# Patient Record
Sex: Female | Born: 1975 | Race: White | Hispanic: No | Marital: Married | Smoking: Never smoker
Health system: Southern US, Community
[De-identification: ages and names within clinical notes are randomized; demographics above are authoritative.]

## PROBLEM LIST (undated history)

## (undated) DIAGNOSIS — G43909 Migraine, unspecified, not intractable, without status migrainosus: Secondary | ICD-10-CM

## (undated) DIAGNOSIS — T7840XA Allergy, unspecified, initial encounter: Secondary | ICD-10-CM

## (undated) DIAGNOSIS — R87619 Unspecified abnormal cytological findings in specimens from cervix uteri: Secondary | ICD-10-CM

## (undated) DIAGNOSIS — L509 Urticaria, unspecified: Secondary | ICD-10-CM

## (undated) DIAGNOSIS — B019 Varicella without complication: Secondary | ICD-10-CM

## (undated) HISTORY — DX: Migraine, unspecified, not intractable, without status migrainosus: G43.909

## (undated) HISTORY — DX: Varicella without complication: B01.9

## (undated) HISTORY — DX: Unspecified abnormal cytological findings in specimens from cervix uteri: R87.619

## (undated) HISTORY — DX: Urticaria, unspecified: L50.9

## (undated) HISTORY — PX: NO PAST SURGERIES: SHX2092

## (undated) HISTORY — DX: Allergy, unspecified, initial encounter: T78.40XA

---

## 2005-11-06 ENCOUNTER — Ambulatory Visit: Payer: Self-pay

## 2006-11-11 ENCOUNTER — Observation Stay: Payer: Self-pay | Admitting: Obstetrics and Gynecology

## 2006-11-29 ENCOUNTER — Inpatient Hospital Stay: Payer: Self-pay

## 2009-02-07 ENCOUNTER — Ambulatory Visit: Payer: Self-pay | Admitting: Otolaryngology

## 2012-09-06 ENCOUNTER — Inpatient Hospital Stay: Payer: Self-pay

## 2012-09-06 LAB — CBC WITH DIFFERENTIAL/PLATELET
Basophil #: 0.1 10*3/uL (ref 0.0–0.1)
Basophil %: 0.5 %
Eosinophil #: 0.1 10*3/uL (ref 0.0–0.7)
Eosinophil %: 0.9 %
HCT: 38.6 % (ref 35.0–47.0)
HGB: 13.1 g/dL (ref 12.0–16.0)
Lymphocyte #: 1.9 10*3/uL (ref 1.0–3.6)
Lymphocyte %: 19.5 %
MCH: 30.8 pg (ref 26.0–34.0)
MCHC: 34 g/dL (ref 32.0–36.0)
MCV: 91 fL (ref 80–100)
Monocyte #: 0.5 x10 3/mm (ref 0.2–0.9)
Monocyte %: 5.1 %
Neutrophil #: 7.2 10*3/uL — ABNORMAL HIGH (ref 1.4–6.5)
Neutrophil %: 74 %
Platelet: 159 10*3/uL (ref 150–440)
RBC: 4.26 10*6/uL (ref 3.80–5.20)
RDW: 13.5 % (ref 11.5–14.5)
WBC: 9.8 10*3/uL (ref 3.6–11.0)

## 2012-09-08 LAB — HEMATOCRIT: HCT: 33.7 % — ABNORMAL LOW (ref 35.0–47.0)

## 2014-10-24 NOTE — H&P (Signed)
L&D Evaluation:  History Expanded:  HPI 39 yo [id number] whose 09/25/12.  Pt followed at [facility] fpr thiw pregnancy.   Blood Type (Maternal) B positive   Group B Strep Results Maternal (Result >5wks must be treated as unknown) negative   Maternal HIV Negative   Maternal Syphilis Ab Nonreactive   Maternal Varicella Immune   Rubella Results (Maternal) immune   EDC 25-Sep-2012   Presents with abdominal pain   Patient's Medical History No Chronic Illness   Patient's Surgical History none   Medications Pre Natal Vitamins   Allergies NKDA   Social History none   Exam:  Vital Signs stable   General labor   Mental Status clear   Chest clear   Heart normal sinus rhythm   Abdomen gravid, non-tender   Estimated Fetal Weight Average for gestational age   Reflexes 2+   Pelvic 5 cm   Mebranes Intact   FHT normal rate with no decels   Ucx regular   Impression:  Impression active labor   Plan:  Plan monitor contractions and for cervical change   Electronic Signatures: Towana Badgerosenow, Philip J (MD)  (Signed 24-Mar-14 22:15)  Authored: L&D Evaluation   Last Updated: 24-Mar-14 22:15 by Towana Badgerosenow, Philip J (MD)

## 2014-12-29 LAB — HM PAP SMEAR: HM Pap smear: NEGATIVE

## 2017-08-04 ENCOUNTER — Other Ambulatory Visit: Payer: Self-pay | Admitting: Obstetrics and Gynecology

## 2017-08-04 DIAGNOSIS — Z1231 Encounter for screening mammogram for malignant neoplasm of breast: Secondary | ICD-10-CM

## 2017-09-23 ENCOUNTER — Encounter: Payer: Self-pay | Admitting: Obstetrics and Gynecology

## 2017-09-23 ENCOUNTER — Ambulatory Visit (INDEPENDENT_AMBULATORY_CARE_PROVIDER_SITE_OTHER): Payer: BC Managed Care – PPO | Admitting: Obstetrics and Gynecology

## 2017-09-23 VITALS — BP 118/76 | Ht 62.0 in | Wt 119.0 lb

## 2017-09-23 DIAGNOSIS — Z1339 Encounter for screening examination for other mental health and behavioral disorders: Secondary | ICD-10-CM | POA: Diagnosis not present

## 2017-09-23 DIAGNOSIS — Z124 Encounter for screening for malignant neoplasm of cervix: Secondary | ICD-10-CM

## 2017-09-23 DIAGNOSIS — Z1331 Encounter for screening for depression: Secondary | ICD-10-CM | POA: Diagnosis not present

## 2017-09-23 DIAGNOSIS — Z01419 Encounter for gynecological examination (general) (routine) without abnormal findings: Secondary | ICD-10-CM

## 2017-09-23 NOTE — Progress Notes (Signed)
Gynecology Annual Exam   PCP: Patient, No Pcp Per  Chief Complaint  Patient presents with  . Annual Exam  . Gynecologic Exam   History of Present Illness:  [name] is a 42 y.o. [id number] who LMP was Patient's last menstrual period was 08/17/2017., presents today for her annual examination.  Her menses are regular every 28-30 days, lasting 7 day(s).  Dysmenorrhea none. She does not have intermenstrual bleeding.  She does not have vasomotor symptoms.   She is single partner, contraception - vasectomy. She does not have vaginal dryness.  Last Pap: 3 years ago  Results were: no abnormalities /neg HPV DNA.  Hx of STDs: none  Last mammogram: has never had one. There is no family history of breast cancer. There is no family history of ovarian cancer. The patient does not do self-breast exams.  Colonoscopy: has never had DEXA: has not been screened for osteoporosis  Tobacco use: The patient denies current or previous tobacco use. Alcohol use: social drinker Exercise: moderately active  The patient wears seatbelts: yes.     Still with occasional right leg pain with menses. Not each month. Just during menses.She notes increased prominence and varicosity of her right leg veins for a few days.   Past Medical History:  Diagnosis Date  . Abnormal Pap smear of cervix    Past Surgical History: denies  Prior to Admission medications   Denies   Allergies  Allergen Reactions  . Amoxicillin   . Azithromycin   . Doxycycline   . Levaquin [Levofloxacin In D5w]    Obstetric History: [id number], s/p SVD x 3  Family History  Problem Relation Age of Onset  . Hypertension Father   . Uterine cancer Maternal Grandmother     Social History   Socioeconomic History  . Marital status: Married    Spouse name: Not on file  . Number of children: Not on file  . Years of education: Not on file  . Highest education level: Not on file  Occupational History  . Not on file  Social  Needs  . Financial resource strain: Not on file  . Food insecurity:    Worry: Not on file    Inability: Not on file  . Transportation needs:    Medical: Not on file    Non-medical: Not on file  Tobacco Use  . Smoking status: Never Smoker  . Smokeless tobacco: Never Used  Substance and Sexual Activity  . Alcohol use: Yes  . Drug use: Not Currently  . Sexual activity: Yes    Birth control/protection: None  Lifestyle  . Physical activity:    Days per week: Not on file    Minutes per session: Not on file  . Stress: Not on file  Relationships  . Social connections:    Talks on phone: Not on file    Gets together: Not on file    Attends religious service: Not on file    Active member of club or organization: Not on file    Attends meetings of clubs or organizations: Not on file    Relationship status: Not on file  . Intimate partner violence:    Fear of current or ex partner: Not on file    Emotionally abused: Not on file    Physically abused: Not on file    Forced sexual activity: Not on file  Other Topics Concern  . Not on file  Social History Narrative  . Not on file  Review of Systems  Constitutional: Negative.   HENT: Negative.   Eyes: Negative.   Respiratory: Negative.   Cardiovascular: Negative.   Gastrointestinal: Negative.   Genitourinary: Negative.   Musculoskeletal: Negative.   Skin: Negative.   Neurological: Negative.   Psychiatric/Behavioral: Negative.      Physical Exam BP 118/76   Ht 5\' 2"  (1.575 m)   Wt 119 lb (54 kg)   LMP 08/17/2017   BMI 21.77 kg/m   Physical Exam  Constitutional: She is oriented to person, place, and time. She appears well-developed and well-nourished. No distress.  Genitourinary: Uterus normal. Pelvic exam was performed with patient supine. There is no rash, tenderness, lesion or injury on the right labia. There is no rash, tenderness, lesion or injury on the left labia. No erythema, tenderness or bleeding in the vagina.  No signs of injury around the vagina. No vaginal discharge found. Right adnexum does not display mass, does not display tenderness and does not display fullness. Left adnexum does not display mass, does not display tenderness and does not display fullness. Cervix does not exhibit motion tenderness, lesion, discharge or polyp.   Uterus is mobile and anteverted. Uterus is not enlarged, tender or exhibiting a mass.  HENT:  Head: Normocephalic and atraumatic.  Eyes: EOM are normal. No scleral icterus.  Neck: Normal range of motion. Neck supple. No thyromegaly present.  Cardiovascular: Normal rate and regular rhythm. Exam reveals no gallop and no friction rub.  No murmur heard. Pulmonary/Chest: Effort normal and breath sounds normal. No respiratory distress. She has no wheezes. She has no rales. Right breast exhibits no inverted nipple, no mass, no nipple discharge, no skin change and no tenderness. Left breast exhibits no inverted nipple, no mass, no nipple discharge, no skin change and no tenderness.  Abdominal: Soft. Bowel sounds are normal. She exhibits no distension and no mass. There is no tenderness. There is no rebound and no guarding.  Musculoskeletal: Normal range of motion. She exhibits no edema or tenderness.  Lymphadenopathy:    She has no cervical adenopathy.       Right: No inguinal adenopathy present.       Left: No inguinal adenopathy present.  Neurological: She is alert and oriented to person, place, and time. No cranial nerve deficit.  Skin: Skin is warm and dry. No rash noted. No erythema.  Psychiatric: She has a normal mood and affect. Her behavior is normal. Judgment normal.    Female chaperone present for pelvic and breast  portions of the physical exam  Results: AUDIT Questionnaire (screen for alcoholism): 3 PHQ-9: 1  Assessment: 42 y.o. [id number] female here for routine gynecologic examination.  Plan: Problem List Items Addressed This Visit    None    Visit  Diagnoses    Women's annual routine gynecological examination    -  Primary   Relevant Orders   IGP, Aptima HPV, rfx 16/18,45   Screening for depression       Screening for alcoholism       Pap smear for cervical cancer screening       Relevant Orders   IGP, Aptima HPV, rfx 16/18,45      Screening: -- Blood pressure screen normal -- Colonoscopy - not due -- Mammogram - due. Patient to call [place] to arrange. She understands that it is her responsibility to arrange this. -- Weight screening: normal -- Depression screening negative (PHQ-9) -- Nutrition: normal -- cholesterol screening: not due for screening -- osteoporosis screening: not due --  tobacco screening: not using -- alcohol screening: AUDIT questionnaire indicates low-risk usage. -- family history of breast cancer screening: done. not at high risk. -- no evidence of domestic violence or intimate partner violence. -- STD screening: gonorrhea/chlamydia NAAT not collected per patient request. -- pap smear collected per ASCCP guidelines -- HPV vaccination series: not eligilbe  Thomasene MohairStephen Iceis Knab, MD 09/23/2017 5:29 PM

## 2017-09-28 LAB — IGP, APTIMA HPV, RFX 16/18,45
HPV Aptima: NEGATIVE
PAP Smear Comment: 0

## 2018-01-11 ENCOUNTER — Other Ambulatory Visit: Payer: Self-pay | Admitting: Obstetrics and Gynecology

## 2018-01-11 DIAGNOSIS — Z1231 Encounter for screening mammogram for malignant neoplasm of breast: Secondary | ICD-10-CM

## 2018-02-16 ENCOUNTER — Ambulatory Visit
Admission: RE | Admit: 2018-02-16 | Discharge: 2018-02-16 | Disposition: A | Discharge status: Home / Self Care | Payer: BC Managed Care – PPO | Source: Ambulatory Visit | Attending: Obstetrics and Gynecology | Admitting: Obstetrics and Gynecology

## 2018-02-16 DIAGNOSIS — Z1231 Encounter for screening mammogram for malignant neoplasm of breast: Secondary | ICD-10-CM

## 2018-08-25 ENCOUNTER — Ambulatory Visit: Payer: BC Managed Care – PPO | Admitting: Family

## 2018-08-25 ENCOUNTER — Other Ambulatory Visit: Payer: Self-pay

## 2018-08-25 ENCOUNTER — Encounter: Payer: Self-pay | Admitting: Family

## 2018-08-25 VITALS — BP 100/62 | HR 88 | Temp 97.9°F | Ht 63.0 in | Wt 116.8 lb

## 2018-08-25 DIAGNOSIS — L509 Urticaria, unspecified: Secondary | ICD-10-CM

## 2018-08-25 DIAGNOSIS — Z0000 Encounter for general adult medical examination without abnormal findings: Secondary | ICD-10-CM | POA: Diagnosis not present

## 2018-08-25 NOTE — Assessment & Plan Note (Signed)
Resolved at this time.  Patient is well-appearing, nontoxic in appearance.  Strong history of allergies.  No other systemic features I could identified.  She politely declines labs today.  We jointly agreed that a referral to allergy is an appropriate next step.  Patient provided information on total histamine blockade if rash were to recur.  Patient will remain vigilant and let me know if recurs

## 2018-08-25 NOTE — Patient Instructions (Addendum)
Very nice to meet you .   Let's start with allergy referral.   Today we discussed referrals, orders. Allergist.    I have placed these orders in the system for you.  Please be sure to give [place] a call if you have not heard from our office regarding this. We should hear from [place] within ONE week with information regarding your appointment. If not, please let me know immediately.   Hives of unknown cause   Tips on Aggravating factors -    These include:  ?Physical factors -  As an example, heat (hot showers, extreme humidity) is a common trigger for many , and tight clothing or straps can also aggravate symptoms.   ?Anti-inflammatory medications - Nonsteroidal anti-inflammatory drugs (NSAIDs) worsen symptoms   ?Stress - Patients often report more severe symptoms during periods of physical or psychologic stress   ?Variations in dietary habits and alcohol - Although food allergy is a rare cause of, some patients will report that variations in diet, particularly rich meals or spicy foods, will aggravate symptoms. Alcohol also aggravates symptoms in some.  If hives do not improve, please let me know and remember :   Histamine 1 blocker - Either benadryl every 8 hours (may be different dosing/duration depending on formulation, please follow directions on bottle)                                         OR  claritin or zyrtec once daily  Histamine 2 blocker- Pepcid AC or Zantac twice a day

## 2018-08-25 NOTE — Progress Notes (Signed)
Subjective:    Patient ID: Nichole Crawford, female    DOB: [birth year], 43 y.o.   MRN: [id number]  CC: Nichole Crawford is a 43 y.o. female who presents today to establish care.    HPI: Complains of red raised lesions that occurred last week, started on ventral aspects of arms, started on left arm and then seen on ventral aspect of right arm; this occurred approx 45 minutes after taking a shower. Not painful, tender. Then started on right leg. Resolved on its own after 4 days.  No fever, cough, trouble breathing, arthralgia, constipation.   Does note new dove coconut product. Does have cat, new since September. Allergy testing when lived in [place], hasnt been tested since moving to [facility] 2006.  Had gotten allergic shots in the past in [place]. No longer on zyrtec. Will use flonase PRN.    No new detergents. Doesn't think rash it's gluten related.   Not sure if related to jewelry to she was wearing or from stress. Primary reason to establish care.    Didn't have a menses last month, which is typical for her, she sometimes misses a month. Has has similar symptoms around menses. Wandering if related.   More stress than normal. Current remodel at their home.   PCP had been GYN prior.   Declines labs today.    HISTORY:  Past Medical History:  Diagnosis Date  . Abnormal Pap smear of cervix   . Allergy    Hay fever/seasonal allergies   . Chicken pox   . Migraines    History reviewed. No pertinent surgical history. Family History  Problem Relation Age of Onset  . Uterine cancer Maternal Grandmother   . COPD Maternal Grandmother   . Other Maternal Grandmother        Huntington's disease  . Huntington's disease Mother   . Asthma Mother   . Other Mother        Huntington's disease  . Mental illness Sister        mulitple personality disorder  . Heart disease Maternal Grandfather   . Hyperlipidemia Maternal Grandfather   . Asthma Paternal Grandmother   . Drug abuse Paternal Grandmother   .  Heart disease Paternal Grandmother   . Hyperlipidemia Paternal Grandmother   . Kidney disease Paternal Grandmother   . Alzheimer's disease Paternal Grandfather     Allergies: Amoxicillin; Azithromycin; Doxycycline; Levaquin [levofloxacin in d5w]; and Penicillins Current Outpatient Medications on File Prior to Visit  Medication Sig Dispense Refill  . Probiotic Product (PROBIOTIC PO) Take 1 capsule by mouth daily.     No current facility-administered medications on file prior to visit.     Social History   Tobacco Use  . Smoking status: Never Smoker  . Smokeless tobacco: Never Used  Substance Use Topics  . Alcohol use: Yes  . Drug use: Not Currently    Review of Systems  Constitutional: Negative for chills and fever.  Respiratory: Negative for cough.   Cardiovascular: Negative for chest pain and palpitations.  Gastrointestinal: Negative for nausea and vomiting.  Musculoskeletal: Negative for arthralgias.  Skin: Positive for rash.      Objective:    BP 100/62 (BP Location: Left Arm, Patient Position: Sitting, Cuff Size: Normal)   Pulse 88   Temp 97.9 F (36.6 C)   Ht 5\' 3"  (1.6 m)   Wt 116 lb 12.8 oz (53 kg)   SpO2 98%   BMI 20.69 kg/m  BP Readings from Last 3  Encounters:  08/25/18 100/62  09/23/17 118/76   Wt Readings from Last 3 Encounters:  08/25/18 116 lb 12.8 oz (53 kg)  09/23/17 119 lb (54 kg)    Physical Exam Vitals signs reviewed.  Constitutional:      Appearance: She is well-developed.  Eyes:     Conjunctiva/sclera: Conjunctivae normal.  Neck:     Thyroid: No thyroid mass or thyromegaly.  Cardiovascular:     Rate and Rhythm: Normal rate and regular rhythm.     Pulses: Normal pulses.     Heart sounds: Normal heart sounds.  Pulmonary:     Effort: Pulmonary effort is normal.     Breath sounds: Normal breath sounds. No wheezing, rhonchi or rales.  Lymphadenopathy:     Head:     Right side of head: No submental, submandibular, tonsillar,  preauricular, posterior auricular or occipital adenopathy.     Left side of head: No submental, submandibular, tonsillar, preauricular, posterior auricular or occipital adenopathy.     Cervical: No cervical adenopathy.  Skin:    General: Skin is warm and dry.  Neurological:     Mental Status: She is alert.  Psychiatric:        Speech: Speech normal.        Behavior: Behavior normal.        Thought Content: Thought content normal.        Assessment & Plan:   Problem List Items Addressed This Visit      Musculoskeletal and Integument   Hives - Primary    Resolved at this time.  Patient is well-appearing, nontoxic in appearance.  Strong history of allergies.  No other systemic features I could identified.  She politely declines labs today.  We jointly agreed that a referral to allergy is an appropriate next step.  Patient provided information on total histamine blockade if rash were to recur.  Patient will remain vigilant and let me know if recurs      Relevant Orders   Ambulatory referral to Allergy     Other   Encounter for medical examination to establish care    Reviewed medical history with patient today.          I am having Hilary Hertz. Cellucci maintain her Probiotic Product (PROBIOTIC PO).   No orders of the defined types were placed in this encounter.   Return precautions given.   Risks, benefits, and alternatives of the medications and treatment plan prescribed today were discussed, and patient expressed understanding.   Education regarding symptom management and diagnosis given to patient on AVS.  Continue to follow with Conard Novak, MD for routine health maintenance.   Hilary Hertz Korver and I agreed with plan.   Rennie Plowman, FNP

## 2018-08-25 NOTE — Assessment & Plan Note (Signed)
Reviewed medical history with patient today 

## 2018-10-06 ENCOUNTER — Ambulatory Visit: Payer: BC Managed Care – PPO | Admitting: Family

## 2018-11-12 ENCOUNTER — Encounter: Payer: Self-pay | Admitting: Family

## 2018-11-12 ENCOUNTER — Ambulatory Visit (INDEPENDENT_AMBULATORY_CARE_PROVIDER_SITE_OTHER): Payer: BC Managed Care – PPO | Admitting: Family

## 2018-11-12 ENCOUNTER — Other Ambulatory Visit: Payer: Self-pay

## 2018-11-12 DIAGNOSIS — R51 Headache: Secondary | ICD-10-CM

## 2018-11-12 DIAGNOSIS — L509 Urticaria, unspecified: Secondary | ICD-10-CM

## 2018-11-12 DIAGNOSIS — R519 Headache, unspecified: Secondary | ICD-10-CM | POA: Insufficient documentation

## 2018-11-12 NOTE — Assessment & Plan Note (Addendum)
HA occurs monthly. Discussed limitations of virtual medicine and the absence of physical exam. No alarm symptoms appreciated over call today. Declines MRI Brain, preventative medication as well as medication for anxiety. She will let me know if HA becomes more bothersome, new or worsening symptoms.

## 2018-11-12 NOTE — Progress Notes (Signed)
This visit type was conducted due to national recommendations for restrictions regarding the COVID-19 pandemic (e.g. social distancing).  This format is felt to be most appropriate for this patient at this time.  All issues noted in this document were discussed and addressed.  No physical exam was performed (except for noted visual exam findings with Video Visits). Virtual Visit via Video Note  I connected with@  on 11/12/18 at  9:30 AM EDT by a video enabled telemedicine application and verified that I am speaking with the correct person using two identifiers.  Location patient: home Location provider:work Persons participating in the virtual visit: patient, provider  I discussed the limitations of evaluation and management by telemedicine and the availability of in person appointments. The patient expressed understanding and agreed to proceed.  Interactive audio and video telecommunications were attempted between this provider and patient, however failed, due to patient having technical difficulties or patient did not have access to video capability.  I was able to see patient initially for video however connection became poor. We continued and completed visit with audio only.   HPI:  Complains of 'sinus headache' , comes and goes; occurs approx once month. Describes as 'pressure.' Not worse HA of life.  Behind both eyes, cheeks,and nose. Occasional nasal drainage.  Teeth grinding makes worse, compliant withwearing oral appliance.  Weather changes, being on computer more, and stress triggers HA as well.  resolves with motrin.  No  vision changes.  HA does not awake her at night, HA is not positional. No  facial numbness/paralyis, nausea, vomiting, photophobia.  Sleeping well however going to bed late due to increased work load, kids homework. Feels HA is similar to HAs in the past.  Wears glasses.   anxiety from increased work of late Works in Programme researcher, broadcasting/film/videoeducation.  Has 3 children at home  Husband  home as well which has been a big change.    Hives- No recurrence.  never heard from allergy   ROS: See pertinent positives and negatives per HPI.  Past Medical History:  Diagnosis Date  . Abnormal Pap smear of cervix   . Allergy    Hay fever/seasonal allergies   . Chicken pox   . Migraines     History reviewed. No pertinent surgical history.  Family History  Problem Relation Age of Onset  . Uterine cancer Maternal Grandmother   . COPD Maternal Grandmother   . Other Maternal Grandmother        Huntington's disease  . Huntington's disease Mother   . Asthma Mother   . Other Mother        Huntington's disease  . Mental illness Sister        mulitple personality disorder  . Heart disease Maternal Grandfather   . Hyperlipidemia Maternal Grandfather   . Asthma Paternal Grandmother   . Drug abuse Paternal Grandmother   . Heart disease Paternal Grandmother   . Hyperlipidemia Paternal Grandmother   . Kidney disease Paternal Grandmother   . Alzheimer's disease Paternal Grandfather     SOCIAL HX: non smoker   Current Outpatient Medications:  .  CALCIUM-MAGNESIUM-VITAMIN D PO, Take by mouth. Powder. One scoop every other week., Disp: , Rfl:  .  Multiple Vitamins-Minerals (EMERGEN-C IMMUNE PLUS) PACK, Take by mouth., Disp: , Rfl:  .  Probiotic Product (PROBIOTIC PO), Take 1 capsule by mouth daily., Disp: , Rfl:   EXAM: [facility] 11/12/2018 09/23/2017  Decreased Interest 0 0  Down, Depressed, Hopeless 0 0  PHQ - 2 Score 0 0  Altered sleeping 0 1  Tired, decreased energy 0 0  Change in appetite 0 0  Feeling bad or failure about yourself  0 0  Trouble concentrating 0 0  Moving slowly or fidgety/restless 0 0  Suicidal thoughts 0 0  PHQ-9 Score 0 1  Difficult doing work/chores Not difficult at all Not difficult at all    VITALS per patient if applicable:  GENERAL: alert, oriented, appears well and in no acute distress  HEENT: atraumatic, conjunttiva  clear, no obvious abnormalities on inspection of external nose and ears  NECK: normal movements of the head and neck  LUNGS: on inspection no signs of respiratory distress, breathing rate appears normal, no obvious gross SOB, gasping or wheezing  CV: no obvious cyanosis  MS: moves all visible extremities without noticeable abnormality  PSYCH/NEURO: pleasant and cooperative, no obvious depression or anxiety, speech and thought processing grossly intact  ASSESSMENT AND PLAN:  Discussed the following assessment and plan:  Nonintractable headache, unspecified chronicity pattern, unspecified headache type  Hives - Plan: Ambulatory referral to Allergy  Problem List Items Addressed This Visit      Musculoskeletal and Integument   Hives    Referral placed to allergy again      Relevant Orders   Ambulatory referral to Allergy     Other   Nonintractable headache - Primary    HA occurs monthly. Discussed limitations of virtual medicine and the absence of physical exam. No alarm symptoms appreciated over call today. Declines MRI Brain, preventative medication. She will let me know if HA becomes more bothersome, new or worsening symptoms.            I discussed the assessment and treatment plan with the patient. The patient was provided an opportunity to ask questions and all were answered. The patient agreed with the plan and demonstrated an understanding of the instructions.   The patient was advised to call back or seek an in-person evaluation if the symptoms worsen or if the condition fails to improve as anticipated.   Rennie Plowman, FNP

## 2018-11-12 NOTE — Assessment & Plan Note (Signed)
Referral placed to allergy again

## 2018-11-12 NOTE — Patient Instructions (Addendum)
Today we discussed referrals, orders. Allergy testing   I have placed these orders in the system for you.  Please be sure to give [place] a call if you have not heard from our office regarding this. We should hear from [place] within ONE week with information regarding your appointment. If not, please let me know immediately.   Please let me know about headaches and certainly if headache worsens or new symptoms present please let me know.

## 2018-11-12 NOTE — Progress Notes (Signed)
Printed and mailed

## 2018-11-29 ENCOUNTER — Encounter: Payer: Self-pay | Admitting: Family

## 2018-11-29 ENCOUNTER — Ambulatory Visit: Payer: Self-pay | Admitting: *Deleted

## 2018-11-29 ENCOUNTER — Ambulatory Visit (INDEPENDENT_AMBULATORY_CARE_PROVIDER_SITE_OTHER): Payer: BC Managed Care – PPO | Admitting: Family

## 2018-11-29 DIAGNOSIS — W5503XA Scratched by cat, initial encounter: Secondary | ICD-10-CM | POA: Diagnosis not present

## 2018-11-29 DIAGNOSIS — L509 Urticaria, unspecified: Secondary | ICD-10-CM | POA: Diagnosis not present

## 2018-11-29 MED ORDER — SULFAMETHOXAZOLE-TRIMETHOPRIM 800-160 MG PO TABS: 1.0000 | ORAL_TABLET | Freq: Two times a day (BID) | ORAL | 0 refills | Status: DC

## 2018-11-29 NOTE — Assessment & Plan Note (Signed)
Concern for potential lymphadenopathy proximal to cat scratch ( is this acute or chronic).  Started patient on bactrim for ppx due to antibiotic allergies. I have sent message to ID and will have close follow up with patient in 5 days. She will remain vigilant and let me know of any new or worsening concerns.

## 2018-11-29 NOTE — Progress Notes (Signed)
This visit type was conducted due to national recommendations for restrictions regarding the COVID-19 pandemic (e.g. social distancing).  This format is felt to be most appropriate for this patient at this time.  All issues noted in this document were discussed and addressed.  No physical exam was performed (except for noted visual exam findings with Video Visits).   Virtual Visit via Video Note  I connected with@  on 12/01/18 at  3:00 PM EDT by a video enabled telemedicine application and verified that I am speaking with the correct person using two identifiers.  Location patient: home Location provider:work  Persons participating in the virtual visit: patient, provider  I discussed the limitations of evaluation and management by telemedicine and the availability of in person appointments. The patient expressed understanding and agreed to proceed.  Interactive audio and video telecommunications were attempted between this provider and patient, however failed, due to patient having technical difficulties or patient did not have access to video capability.  We continued and completed visit with audio only.   HPI:  Scratched by her own cat x 3 days ago on right upper arm, right side of chest and neck. Washed with warm water, soap; applied neosporin ( expired) , and wanders if neosporin.    Noticed today that hives after getting out of shower primarily right upper arm and right upper chest. Improved. Some itching , not intense. Scratch is healed. No discharge.  Cat is current on all her shots and an indoor cat.   Right axillary lymph node as always noticed especially prior to menses, has noted it is 'tender' . Not enlarged.   No sob, trouble breathing,  arthralgia, fever, vision changes, purulent discharge.   Pictures seen via mychart  Seeing allergy 5/34/20.     ROS: See pertinent positives and negatives per HPI.  Past Medical History:  Diagnosis Date  . Abnormal Pap smear of cervix    . Allergy    Hay fever/seasonal allergies   . Chicken pox   . Migraines     No past surgical history on file.  Family History  Problem Relation Age of Onset  . Uterine cancer Maternal Grandmother   . COPD Maternal Grandmother   . Other Maternal Grandmother        Huntington's disease  . Huntington's disease Mother   . Asthma Mother   . Other Mother        Huntington's disease  . Mental illness Sister        mulitple personality disorder  . Heart disease Maternal Grandfather   . Hyperlipidemia Maternal Grandfather   . Asthma Paternal Grandmother   . Drug abuse Paternal Grandmother   . Heart disease Paternal Grandmother   . Hyperlipidemia Paternal Grandmother   . Kidney disease Paternal Grandmother   . Alzheimer's disease Paternal Grandfather     Never smoker  Current Outpatient Medications:  .  CALCIUM-MAGNESIUM-VITAMIN D PO, Take by mouth. Powder. One scoop every other week., Disp: , Rfl:  .  Multiple Vitamins-Minerals (EMERGEN-C IMMUNE PLUS) PACK, Take by mouth., Disp: , Rfl:  .  Probiotic Product (PROBIOTIC PO), Take 1 capsule by mouth daily., Disp: , Rfl:  .  sulfamethoxazole-trimethoprim (BACTRIM DS) 800-160 MG tablet, Take 1 tablet by mouth 2 (two) times daily., Disp: 20 tablet, Rfl: 0  EXAM:  VITALS per patient if applicable:  GENERAL: alert, oriented, appears well and in no acute distress  HEENT: atraumatic, conjunttiva clear, no obvious abnormalities on inspection of external nose and ears  NECK: normal movements of the head and neck  LUNGS: on inspection no signs of respiratory distress, breathing rate appears normal, no obvious gross SOB, gasping or wheezing  CV: no obvious cyanosis  MS: moves all visible extremities without noticeable abnormality  PSYCH/NEURO: pleasant and cooperative, no obvious depression or anxiety, speech and thought processing grossly intact  SKIN: Well approximated scabbed over laceration seen on right upper arm and axilla  area.  No purulent discharge.  No streaking  ASSESSMENT AND PLAN:  Discussed the following assessment and plan:   Problem List Items Addressed This Visit      Musculoskeletal and Integument   Hives    Idiopathic. Patient appears highly allergic and am considering allergic to one of the products in neosporin, anxiety, and/or change in temperature ( shower) to have caused hives. Advised histamine blockade and close follow up. Pending allergy appointment this  Month.       Cat scratch - Primary    Concern for potential lymphadenopathy proximal to cat scratch ( is this acute or chronic).  Started patient on bactrim for ppx due to antibiotic allergies. I have sent message to ID and will have close follow up with patient in 5 days. She will remain vigilant and let me know of any new or worsening concerns.       Relevant Medications   sulfamethoxazole-trimethoprim (BACTRIM DS) 800-160 MG tablet   Other Relevant Orders   Ambulatory referral to Infectious Disease        I discussed the assessment and treatment plan with the patient. The patient was provided an opportunity to ask questions and all were answered. The patient agreed with the plan and demonstrated an understanding of the instructions.   The patient was advised to call back or seek an in-person evaluation if the symptoms worsen or if the condition fails to improve as anticipated.   Rennie PlowmanMargaret , FNP   I spent 25 min non face to face w/ pt.

## 2018-11-29 NOTE — Telephone Encounter (Signed)
Message from Rayann Heman sent at 11/29/2018 8:35 AM EDT  Summary: hives    It called and stated that her cat scratched her and the spot has hives on it. Please advise          Returned call to patient regarding the scratch from her cat on Friday. She was trying to move the cat from one place to another but the cat became frightened and  Scratched her on the right arm about 2 to 3 inches above the elbow and near her arm pit. She noticed that the one near her arm pit has bumps that look like hives. She denies itching, there is some redness around the scratch. The area tender but she thinks it is be cause she moves her arm. She cleaned the area with soap and water and has used neosporin on it and realized that this medication has expired. She has taken pictures of it and placed in MyChart. Advised of a virtual appointment.  LB at [facility] Call transferred to the practice.  Reason for Disposition . [1] Looks infected (spreading redness, pus) AND [2] no fever  Answer Assessment - Initial Assessment Questions 1. APPEARANCE of INJURY: "What does the injury look like?"      Looks like a rash near wear the cat scratched her on the right arm 2. SIZE: "How large is the cut?"      Not a cut 3. BLEEDING: "Is it bleeding now?" If so, ask: "Is it difficult to stop?"      No bleeding 4. LOCATION: "Where is the injury located?"      Right arm 5. ONSET: "How long ago did the injury occur?"      Friday evening 6. MECHANISM: "Tell me how it happened."      Tried to take the cat in but the cat got scared and scratched her 7. TETANUS: "When was the last tetanus booster?"     Thinks it has been between the last 10 years 8. PREGNANCY: "Is there any chance you are pregnant?" "When was your last menstrual period?"     Not pregnant. LMP was middle of May  Protocols used: [place]

## 2018-11-29 NOTE — Telephone Encounter (Signed)
appt scheduled to see the provider pt notified.  Phillis Thackeray,cma

## 2018-11-29 NOTE — Assessment & Plan Note (Signed)
Idiopathic. Patient appears highly allergic and am considering allergic to one of the products in neosporin, anxiety, and/or change in temperature ( shower) to have caused hives. Advised histamine blockade and close follow up. Pending allergy appointment this  Month.

## 2018-11-29 NOTE — Patient Instructions (Addendum)
Lets stay in close touch and we will have follow up on Friday especially as we stay vigilant with any concerns of infection or development of cat scratch disease. I want you to watch the lymph node carefully.   Start bactrim for empiric therapy for cat scratch disease.   Ensure to take probiotics while on antibiotics and also for 2 weeks after completion. It is important to re-colonize the gut with good bacteria and also to prevent any diarrheal infections associated with antibiotic use.   I have sent urgent message to infectious disease as well.   Today we discussed referrals, orders. Infectious disease.    I have placed these orders in the system for you.  Please be sure to give [place] a call if you have not heard from our office regarding this. We should hear from [place] within ONE week with information regarding your appointment. If not, please let me know immediately.    Hives of unknown cause  Tips on Aggravating factors -    These include:  ?Physical factors -  As an example, heat (hot showers, extreme humidity) is a common trigger for many , and tight clothing or straps can also aggravate symptoms.   ?Anti-inflammatory medications - Nonsteroidal anti-inflammatory drugs (NSAIDs) worsen symptoms   ?Stress - Patients often report more severe symptoms during periods of physical or psychologic stress   ?Variations in dietary habits and alcohol - Although food allergy is a rare cause of, some patients will report that variations in diet, particularly rich meals or spicy foods, will aggravate symptoms. Alcohol also aggravates symptoms in some.  If hives do not improve, please let me know and remember :   Histamine 1 blocker - Either benadryl every 8 hours (may be different dosing/duration depending on formulation, please follow directions on bottle)                                         OR  claritin or zyrtec once daily  Histamine 2 blocker- Pepcid AC twice a day   Cat-Scratch Disease,  Adult Cat-scratch disease is a bacterial infection that is caused by a bite or scratch from an infected cat. It is sometimes called cat-scratch fever. The infection can cause a red bump, swollen glands, and flu-like symptoms. The infection does not spread (is not contagious) between humans. In most cases, the infection is mild and it clears up without treatment. However, a more severe infection may develop in people who have other illnesses or problems that weaken the body's disease-fighting system (immune system). What are the causes? This condition is caused by bacteria called Bartonella henselae. These bacteria may be present in the mouth or on the claws of cats or kittens, especially those that are younger than 44 year old. Cats do not look or act sick when they have this infection. The bacteria can spread to humans through:  A bite or scratch.  Having contact with an infected cat and then touching your eyes or mouth. What increases the risk? You are more likely to develop this condition if:  You own or interact with a cat.  You have a weakened immune system. Your immune system may be weakened if: ? You are pregnant. ? You have certain conditions like cancer, HIV, or AIDS. ? You received a donated organ (transplant). What are the signs or symptoms?     The first symptoms may  be:  A bite or scratch that does not heal.  A red bump near the wound. The bump may be red, warm, and tender to the touch. Other symptoms may take a few weeks to develop. They may include:  Swollen, tender glands (lymph nodes) in the neck or under the arms.  Fever.  Rash.  Joint pain.  Headache.  Low energy.  Poor appetite. How is this diagnosed? This condition is diagnosed based on your symptoms and your history of a scratch or bite from a cat. Your health care provider will examine your skin and look for swollen lymph nodes. You may have tests, such as:  A culture test. This involves testing a  sample of fluid or pus from your wound.  Blood tests.  A lymph node biopsy. This involves removing and testing a tissue sample from a swollen lymph node. How is this treated? Cat-scratch disease usually goes away without treatment in 2-4 months. Your health care provider may recommend home care, such as using a warm cloth (compress) and over-the-counter pain medicine. You may be prescribed antibiotic medicine if:  Your immune system is weakened.  Your symptoms cause discomfort. If you have a painful, swollen lymph node, it may need to be drained with a needle. (This is rare.) Follow these instructions at home:  Rest and return to your normal activities as told by your health care provider.  Take over-the-counter and prescription medicines only as told by your health care provider.  If you were prescribed an antibiotic medicine, take it as told by your health care provider. Do not stop taking the antibiotic even if you start to feel better.  Apply warm compresses to the area as told your health care provider.  Check your wound or swollen gland areas every day for signs of infection, such as: ? Redness, swelling, or pain. ? Fluid or blood. ? Warmth. ? Pus or a bad smell.  Keep all follow-up visits as told by your health care provider. This is important. How is this prevented?  Avoid being scratched or bitten while playing with cats. To do this, avoid playing roughly or taking food away from a cat.  Wash your hands well after you have contact with a cat.  If you get scratched or bitten, wash the injured area as soon as possible with warm water and soap.  Do not let a cat lick your skin if you have any cuts, sores, or scratches.  Do not pick up or play with stray cats.  If you have an indoor cat, do not let your cat outside. Having contact with stray cats may make your cat more likely to have contact with the bacteria. Contact a health care provider if:  You have redness,  swelling, or pain around your wound.  You have fluid, blood, or pus coming from your wound.  Your wound is warm to the touch.  There is pus or a bad smell coming from your wound.  You have a fever.  Your symptoms get worse or do not get better at home. Get help right away if:  You have more swelling of your lymph nodes in your neck or under your arms.  You develop pain in your abdomen.  You develop a skin rash.  You feel dizzy or you faint.  You develop inflammation of your eye or vision problems.  You have pain in your bones.  You develop a stiff neck. Summary  Cat-scratch disease, sometimes called cat-scratch fever, is a  bacterial infection that is caused by a bite or scratch from an infected cat.  The infection can cause a red bump, swollen glands, and flu-like symptoms.  Bacteria that cause this condition may be present in the mouth or on the claws of cats or kittens, especially those that are younger than 43 year old.  If you were prescribed an antibiotic medicine, take it as told by your health care provider. Do not stop taking the antibiotic even if you start to feel better. This information is not intended to replace advice given to you by your health care provider. Make sure you discuss any questions you have with your health care provider. Document Released: 05/30/2000 Document Revised: 02/26/2017 Document Reviewed: 02/26/2017 Elsevier Interactive Patient Education  2019 [employer]

## 2018-12-03 ENCOUNTER — Encounter: Payer: Self-pay | Admitting: Family

## 2018-12-03 ENCOUNTER — Ambulatory Visit (INDEPENDENT_AMBULATORY_CARE_PROVIDER_SITE_OTHER): Payer: BC Managed Care – PPO | Admitting: Family

## 2018-12-03 DIAGNOSIS — W5503XD Scratched by cat, subsequent encounter: Secondary | ICD-10-CM | POA: Diagnosis not present

## 2018-12-03 DIAGNOSIS — N644 Mastodynia: Secondary | ICD-10-CM

## 2018-12-03 DIAGNOSIS — R599 Enlarged lymph nodes, unspecified: Secondary | ICD-10-CM | POA: Diagnosis not present

## 2018-12-03 DIAGNOSIS — W5503XA Scratched by cat, initial encounter: Secondary | ICD-10-CM

## 2018-12-03 NOTE — Progress Notes (Signed)
This visit type was conducted due to national recommendations for restrictions regarding the COVID-19 pandemic (e.g. social distancing).  This format is felt to be most appropriate for this patient at this time.  All issues noted in this document were discussed and addressed.  No physical exam was performed (except for noted visual exam findings with Video Visits). Virtual Visit via Video Note  I connected with@  on 12/03/18 at  2:00 PM EDT by a video enabled telemedicine application and verified that I am speaking with the correct person using two identifiers.  Location patient: home Location provider:work  Persons participating in the virtual visit: patient, provider  I discussed the limitations of evaluation and management by telemedicine and the availability of in person appointments. The patient expressed understanding and agreed to proceed.  Interactive audio and video telecommunications were attempted between this provider and patient, however failed, due to patient having technical difficulties or patient did not have access to video capability.  We continued and completed visit with audio only.    HPI:  Follow up cat scratch.   Redness and itchiness improved. Describes as surrounding skin from scratch is brown in color; it is no longer red.   no purulent discharge.   Right Axillary lymph  Node is still palpable on occasion, 'same size as usually has been in monthly cycle'. Has been there for 12 years. Unable to feel lymph node today.   Also notes, BL breasts both outer quadrant are tender pver past couple of days, right side outer quadrant of breast is more tender, and getting ready to start cycle. These symptoms are typical for her. No masses in breasts appreciated.  No nipple discharge.  No fever, ha,n, v, vision changes.    ROS: See pertinent positives and negatives per HPI.  Past Medical History:  Diagnosis Date  . Abnormal Pap smear of cervix   . Allergy    Hay  fever/seasonal allergies   . Chicken pox   . Migraines     History reviewed. No pertinent surgical history.  Family History  Problem Relation Age of Onset  . Uterine cancer Maternal Grandmother   . COPD Maternal Grandmother   . Other Maternal Grandmother        Huntington's disease  . Huntington's disease Mother   . Asthma Mother   . Other Mother        Huntington's disease  . Mental illness Sister        mulitple personality disorder  . Heart disease Maternal Grandfather   . Hyperlipidemia Maternal Grandfather   . Asthma Paternal Grandmother   . Drug abuse Paternal Grandmother   . Heart disease Paternal Grandmother   . Hyperlipidemia Paternal Grandmother   . Kidney disease Paternal Grandmother   . Alzheimer's disease Paternal Grandfather     SOCIAL HX: never smoker   Current Outpatient Medications:  .  CALCIUM-MAGNESIUM-VITAMIN D PO, Take by mouth. Powder. One scoop every other week., Disp: , Rfl:  .  Multiple Vitamins-Minerals (EMERGEN-C IMMUNE PLUS) PACK, Take by mouth., Disp: , Rfl:  .  Probiotic Product (PROBIOTIC PO), Take 1 capsule by mouth daily., Disp: , Rfl:  .  sulfamethoxazole-trimethoprim (BACTRIM DS) 800-160 MG tablet, Take 1 tablet by mouth 2 (two) times daily., Disp: 20 tablet, Rfl: 0    ASSESSMENT AND PLAN:  Discussed the following assessment and plan: Problem List Items Addressed This Visit      Musculoskeletal and Integument   Cat scratch    Improved on bactrim.  Declines infectious disease consult at this time.  Based on conversation with Terri Piedra, NP with infectious disease, Bactrim is appropriate therapy.  patient will monitor for symptoms and let me know if she does not continue to experience improvement. Acute on chronic right axillary lymph node. Doubt r/t to cat scratch based on chronicity.  Advised dedicated ultrasound for this.  Patient agreeable.  Pending ultrasound.         Other   Breast tenderness    Bilateral, described as  typical, chronic per patient.  Advise her to let me know if does not resolve so we can pursue diagnostic imaging.  Patient verbalized understanding       Other Visit Diagnoses    Enlarged lymph node    -  Primary   Relevant Orders   [place] AXILLA RIGHT      I discussed the assessment and treatment plan with the patient. The patient was provided an opportunity to ask questions and all were answered. The patient agreed with the plan and demonstrated an understanding of the instructions.   The patient was advised to call back or seek an in-person evaluation if the symptoms worsen or if the condition fails to improve as anticipated.   Mable Paris, FNP   I spent 25 min non face to face w/ pt.

## 2018-12-03 NOTE — Assessment & Plan Note (Signed)
Bilateral, described as typical, chronic per patient.  Advise her to let me know if does not resolve so we can pursue diagnostic imaging.  Patient verbalized understanding

## 2018-12-03 NOTE — Patient Instructions (Addendum)
Complete bactrim  Ensure to take probiotics while on antibiotics and also for 2 weeks after completion. It is important to re-colonize the gut with good bacteria and also to prevent any diarrheal infections associated with antibiotic use.   Let me know if breast pain doesn't resolve with menses; if persists please call the office.   Today we discussed referrals, orders. Ultrasound of lymph node right axillary   I have placed these orders in the system for you.  Please be sure to give [place] a call if you have not heard from our office regarding this. We should hear from [place] within ONE week with information regarding your appointment. If not, please let me know immediately.    Let me know if you would to see infectious disease.

## 2018-12-03 NOTE — Assessment & Plan Note (Addendum)
Improved on bactrim. Declines infectious disease consult at this time.  Based on conversation with Terri Piedra, NP with infectious disease, Bactrim is appropriate therapy.  patient will monitor for symptoms and let me know if she does not continue to experience improvement. Acute on chronic right axillary lymph node. Doubt r/t to cat scratch based on chronicity.  Advised dedicated ultrasound for this.  Patient agreeable.  Pending ultrasound.

## 2018-12-08 ENCOUNTER — Ambulatory Visit: Payer: BC Managed Care – PPO | Admitting: Allergy

## 2018-12-15 ENCOUNTER — Ambulatory Visit: Payer: BC Managed Care – PPO | Admitting: Allergy

## 2018-12-29 ENCOUNTER — Ambulatory Visit: Payer: BC Managed Care – PPO | Admitting: Allergy

## 2018-12-30 ENCOUNTER — Ambulatory Visit (INDEPENDENT_AMBULATORY_CARE_PROVIDER_SITE_OTHER): Payer: BC Managed Care – PPO | Admitting: Family Medicine

## 2018-12-30 ENCOUNTER — Other Ambulatory Visit: Payer: Self-pay

## 2018-12-30 DIAGNOSIS — W5503XA Scratched by cat, initial encounter: Secondary | ICD-10-CM

## 2018-12-30 DIAGNOSIS — N644 Mastodynia: Secondary | ICD-10-CM

## 2018-12-30 MED ORDER — METRONIDAZOLE 500 MG PO TABS: 500.0000 mg | ORAL_TABLET | Freq: Three times a day (TID) | ORAL | 0 refills | Status: DC

## 2018-12-30 NOTE — Progress Notes (Signed)
Patient ID: Nichole Crawford, female   DOB: 1976/04/03, 43 y.o.   MRN: [id number]    Virtual Visit via video Note  This visit type was conducted due to national recommendations for restrictions regarding the COVID-19 pandemic (e.g. social distancing).  This format is felt to be most appropriate for this patient at this time.  All issues noted in this document were discussed and addressed.  No physical exam was performed (except for noted visual exam findings with Video Visits).   I connected with Eulogio Ditch today at  2:00 PM EDT by a video enabled telemedicine application and verified that I am speaking with the correct person using two identifiers. Location patient: home Location provider: work or home office Persons participating in the virtual visit: patient, provider  I discussed the limitations, risks, security and privacy concerns of performing an evaluation and management service by telephone and the availability of in person appointments. I also discussed with the patient that there may be a patient responsible charge related to this service. The patient expressed understanding and agreed to proceed.   HPI: Patient and I connected via video due to bilateral breast tenderness, right more than left and wondering if infection in right breast skin from cat scratches not fully resolved.  Patient was seen by Mable Paris on 12/03/2018 due to cat scratch.  She was treated with a course of Bactrim to cover infection.  Patient has multiple antibiotic allergies including doxycycline, penicillin, azithromycin so this is why Bactrim was chosen.  Patient states right breast feels similar to when she had mastitis.  States left breast is tender, but right breast is more so tender.  States her left breast feels a "normal kind of tenderness like similar to being on my period'  Denies any fever or chills.  Denies any redness or streaking of red on skin.  Denies any discharge from cat scratch area or any  discharge from nipples.  Denies inverted nipples.  Denies feeling a mass or lump in breasts.   ROS: See pertinent positives and negatives per HPI.  Past Medical History:  Diagnosis Date  . Abnormal Pap smear of cervix   . Allergy    Hay fever/seasonal allergies   . Chicken pox   . Migraines     No past surgical history on file.  Family History  Problem Relation Age of Onset  . Uterine cancer Maternal Grandmother   . COPD Maternal Grandmother   . Other Maternal Grandmother        Huntington's disease  . Huntington's disease Mother   . Asthma Mother   . Other Mother        Huntington's disease  . Mental illness Sister        mulitple personality disorder  . Heart disease Maternal Grandfather   . Hyperlipidemia Maternal Grandfather   . Asthma Paternal Grandmother   . Drug abuse Paternal Grandmother   . Heart disease Paternal Grandmother   . Hyperlipidemia Paternal Grandmother   . Kidney disease Paternal Grandmother   . Alzheimer's disease Paternal Grandfather    Social History   Tobacco Use  . Smoking status: Never Smoker  . Smokeless tobacco: Never Used  Substance Use Topics  . Alcohol use: Yes    Current Outpatient Medications:  .  CALCIUM-MAGNESIUM-VITAMIN D PO, Take by mouth. Powder. One scoop every other week., Disp: , Rfl:  .  Multiple Vitamins-Minerals (EMERGEN-C IMMUNE PLUS) PACK, Take by mouth., Disp: , Rfl:  .  Probiotic Product (PROBIOTIC  PO), Take 1 capsule by mouth daily., Disp: , Rfl:  .  sulfamethoxazole-trimethoprim (BACTRIM DS) 800-160 MG tablet, Take 1 tablet by mouth 2 (two) times daily., Disp: 20 tablet, Rfl: 0  EXAM:  GENERAL: alert, oriented, appears well and in no acute distress  HEENT: atraumatic, conjunttiva clear, no obvious abnormalities on inspection of external nose and ears  NECK: normal movements of the head and neck  LUNGS: on inspection no signs of respiratory distress, breathing rate appears normal, no obvious gross SOB,  gasping or wheezing  Breast not evaluated over video due to sensitive area of body  CV: no obvious cyanosis  MS: moves all visible extremities without noticeable abnormality  PSYCH/NEURO: pleasant and cooperative, no obvious depression or anxiety, speech and thought processing grossly intact  ASSESSMENT AND PLAN:  Discussed the following assessment and plan:  Cat scratch-long discussion with patient in regards to possibility of multiple antibiotics needed at times for cat scratches or cat bites.  Advised Bactrim is a great choice for skin type infections, but there are times when another antibiotic class is needed.  Due to her antibiotic allergies, I will prescribe a course of Flagyl.  Advised to continue probiotic daily and eating yogurt daily.  Advised to monitor cat scratch area for any worsening redness or development of drainage/discharge from the cat scratch.  Breast tenderness/enlarged lymhnode axilla -tenderness in the right breast/feelings of enlarged lymphnode could be attributed to the cat scratch in that region however we cannot be 100% sure.  Due to both breasts being tender, we will get diagnostic imaging including mammogram and ultrasounds that we will look at the axilla area as well.   A total of 25  minutes were spent face-to-face with the patient during this encounter and over half of that time was spent on counseling and coordination of care. The patient was counseled on antibiotic choices, need for breast imaging due to persistent tenderness, US versus mammogram and why getting both is needed.   I discussed the assessment and treatment plan with the patient. The patient was provided an opportunity to ask questions and all were answered. The patient agreed with the plan and demonstrated an understanding of the instructions.   The patient was advised to call back or seek an in-person evaluation if the symptoms worsen or if the condition fails to improve as anticipated.     Tracey HarriesLauren M Guse, FNP

## 2019-01-04 ENCOUNTER — Encounter: Payer: Self-pay | Admitting: Family Medicine

## 2019-01-04 ENCOUNTER — Telehealth: Payer: Self-pay

## 2019-01-04 NOTE — Telephone Encounter (Signed)
Copied from [place] [id number]. Topic: General - Inquiry >> Jan 04, 2019 12:14 PM Richardo Priest, NT wrote: Reason for CRM: Patient called in asking if she can get an extension of current antibiotic for her flagyl to help with her breast lymph node. States she would like it extended to 10 days instead 7 days. Patient is also wanting to have her mammogram through her normal office at the [place], due to having issues with [facility]. Please advise and call patient back with information about both. Call back is [phone].

## 2019-01-05 ENCOUNTER — Other Ambulatory Visit: Payer: Self-pay

## 2019-01-05 ENCOUNTER — Ambulatory Visit
Admission: RE | Admit: 2019-01-05 | Discharge: 2019-01-05 | Disposition: A | Discharge status: Home / Self Care | Payer: BC Managed Care – PPO | Source: Ambulatory Visit | Attending: Family | Admitting: Family

## 2019-01-05 DIAGNOSIS — R599 Enlarged lymph nodes, unspecified: Secondary | ICD-10-CM

## 2019-01-31 ENCOUNTER — Other Ambulatory Visit: Payer: Self-pay

## 2019-01-31 ENCOUNTER — Ambulatory Visit
Admission: RE | Admit: 2019-01-31 | Discharge: 2019-01-31 | Disposition: A | Discharge status: Home / Self Care | Payer: BC Managed Care – PPO | Source: Ambulatory Visit | Attending: Family Medicine | Admitting: Family Medicine

## 2019-01-31 ENCOUNTER — Other Ambulatory Visit: Payer: Self-pay | Admitting: Family Medicine

## 2019-01-31 ENCOUNTER — Ambulatory Visit: Payer: BC Managed Care – PPO

## 2019-01-31 DIAGNOSIS — N644 Mastodynia: Secondary | ICD-10-CM

## 2019-02-01 ENCOUNTER — Encounter: Payer: Self-pay | Admitting: Family Medicine

## 2019-02-07 ENCOUNTER — Ambulatory Visit: Payer: BC Managed Care – PPO | Admitting: Allergy

## 2019-02-07 ENCOUNTER — Encounter: Payer: Self-pay | Admitting: Allergy

## 2019-02-07 ENCOUNTER — Other Ambulatory Visit: Payer: Self-pay

## 2019-02-07 VITALS — BP 110/82 | HR 80 | Temp 97.6°F | Resp 16 | Ht 62.5 in | Wt 116.2 lb

## 2019-02-07 DIAGNOSIS — Z881 Allergy status to other antibiotic agents status: Secondary | ICD-10-CM | POA: Diagnosis not present

## 2019-02-07 DIAGNOSIS — R21 Rash and other nonspecific skin eruption: Secondary | ICD-10-CM

## 2019-02-07 DIAGNOSIS — J3089 Other allergic rhinitis: Secondary | ICD-10-CM

## 2019-02-07 NOTE — Assessment & Plan Note (Signed)
Concerned about antibiotic allergy list on her chart. Recently her cat scratched her and had to take bactrim and flagyl for this. Penicillin caused hives as a baby. Azithromycin gave her an abdominal rash. Tolerated previously with no issues. Doxycycline and Levaquin caused brain fog and did not feel well on it.   Continue to avoid above antibiotics.  Schedule for penicillin skin testing/drug challenge first.   Then will do azithromycin drug challenge next.

## 2019-02-07 NOTE — Assessment & Plan Note (Signed)
Skin testing in 2005 was positive to pollen per patient report. Underwent 1 year of AIT with good benefit which decreased her sinus/ear infections. Currently has minimal symptoms for which she takes homeopathic sinus medication and Flonase prn with good benefit.  Monitor symptoms.  May use over the counter antihistamines such as Zyrtec (cetirizine), Claritin (loratadine), Allegra (fexofenadine), or Xyzal (levocetirizine) daily as needed.  May take Flonase 1 spray per nostril 1-2 times a day as needed for sinus symptoms.

## 2019-02-07 NOTE — Progress Notes (Signed)
New Patient Note  RE: Nichole Crawford MRN: [id number] DOB: 04/17/1976 Date of Office Visit: 02/07/2019  Referring provider: Allegra GranaArnett, Margaret G, FNP Primary care provider: Allegra GranaArnett, Margaret G, FNP  Chief Complaint: Urticaria (hives), Penicillins (broke out in hives as an infant after taking), and Allergic Rhinitis  (had allergy shots some years ago, seen an allergist years ago)  History of Present Illness: I had the pleasure of seeing Nichole Crawford for initial evaluation at the [facility] on 02/07/2019. She is a 43 y.o. female, who is referred here by Allegra GranaArnett, Margaret G, FNP for the evaluation of rash/drug allergies.   Rash:  This occurred in Feb/March 2020. Mainly showed up on her forearms bilaterally. Describes them as not pruritic but erythematous and bumpy. This lasted about 1 week. No ecchymosis upon resolution. Associated symptoms include: SOB, chest burning, exhaustion, headaches occurred about 1 week prior to the rash. Suspected triggers are unknown. Denies any fevers, chills, changes in medications, foods, personal care products or recent infections. She has tried the following therapies: cortisone cream with no benefit. Systemic steroids none. Currently on no medications.  Previous work up includes: none. Previous history of rash/hives: no. Patient is up to date with the following cancer screening tests: mammogram, pap smears.  Assessment and Plan: Nichole Crawford is a 43 y.o. female with: Rash and other nonspecific skin eruption One week history of rash on the forearms b/l in Feb/March 2020. Complained of SOB, chest burning, exhaustion and headaches 1 week prior to rash onset. Describes rash as red and bumpy. Not pruritic. No previous history of rash like this. Denies any changes in medications, diet, or personal care products.  Discussed with patient it is difficult to decipher what may have caused the rash given her history. As the rash resolved without any recurrence  there is no indication for any work up at this time.  If rash reoccurs, take pictures and let us know.   Allergy to multiple antibiotics Concerned about antibiotic allergy list on her chart. Recently her cat scratched her and had to take bactrim and flagyl for this. Penicillin caused hives as a baby. Azithromycin gave her an abdominal rash. Tolerated previously with no issues. Doxycycline and Levaquin caused brain fog and did not feel well on it.   Continue to avoid above antibiotics.  Schedule for penicillin skin testing/drug challenge first.   Then will do azithromycin drug challenge next.   Other allergic rhinitis Skin testing in 2005 was positive to pollen per patient report. Underwent 1 year of AIT with good benefit which decreased her sinus/ear infections. Currently has minimal symptoms for which she takes homeopathic sinus medication and Flonase prn with good benefit.  Monitor symptoms.  May use over the counter antihistamines such as Zyrtec (cetirizine), Claritin (loratadine), Allegra (fexofenadine), or Xyzal (levocetirizine) daily as needed.  May take Flonase 1 spray per nostril 1-2 times a day as needed for sinus symptoms.    Return for Drug challenge.  Other allergy screening: Asthma: no Rhino conjunctivitis: yes  She reports symptoms of itchy eyes, sneezing. Symptoms have been going on for many years. The symptoms are present during the spring through fall. Other triggers include exposure to pollen and weather change. Anosmia: no.She has used homeopathic sinus medication and Flonase prn fair improvement in symptoms. Sinus infections: yes in the past. Previous work up includes: skin testing in 2005 was positive to pollen per patient report. She was on allergy injections for 1 year which helped.  Last eye exam: las year. Food allergy:  Tomatoes cause finger swelling? Medication allergy: yes  Amoxicillin/penicillin - hives as a baby She was prescribed multiple antibiotics  due to frequent sinus/ear infections in the past which improved after allergy injections.  Azithromycin - used to tolerate but then developed some type of rash on her abdomen once.  Patient does not believe doxycyline and Levaquin caused any allergic symptoms. She just did not feel well on it. Had some brain fog and possibly abdominal discomfort.  Denies any oral ulcerations or sloughing of the skin with any of the above antibiotics.  Hymenoptera allergy: no Urticaria: once in college during URI Eczema:no History of recurrent infections suggestive of immunodeficency: no  Diagnostics: None.   Past Medical History: Patient Active Problem List   Diagnosis Date Noted  . Allergy to multiple antibiotics 02/07/2019  . Rash and other nonspecific skin eruption 02/07/2019  . Other allergic rhinitis 02/07/2019  . Breast tenderness 12/03/2018  . Cat scratch 11/29/2018  . Nonintractable headache 11/12/2018  . Hives 08/25/2018  . Encounter for medical examination to establish care 08/25/2018   Past Medical History:  Diagnosis Date  . Abnormal Pap smear of cervix   . Allergy    Hay fever/seasonal allergies   . Chicken pox   . Migraines    Past Surgical History: History reviewed. No pertinent surgical history. Medication List:  Current Outpatient Medications  Medication Sig Dispense Refill  . CALCIUM-MAGNESIUM-VITAMIN D PO Take by mouth. Powder. One scoop every other week.    . Multiple Vitamins-Minerals (EMERGEN-C IMMUNE PLUS) PACK Take by mouth.    . Probiotic Product (PROBIOTIC PO) Take 1 capsule by mouth daily.     No current facility-administered medications for this visit.    Allergies: Allergies  Allergen Reactions  . Amoxicillin   . Azithromycin   . Doxycycline   . Levaquin [Levofloxacin In D5w]   . Penicillins Rash   Social History: Social History   Socioeconomic History  . Marital status: Married    Spouse name: Not on file  . Number of children: Not on file  .  Years of education: Not on file  . Highest education level: Not on file  Occupational History  . Not on file  Social Needs  . Financial resource strain: Not on file  . Food insecurity    Worry: Not on file    Inability: Not on file  . Transportation needs    Medical: Not on file    Non-medical: Not on file  Tobacco Use  . Smoking status: Never Smoker  . Smokeless tobacco: Never Used  Substance and Sexual Activity  . Alcohol use: Yes  . Drug use: Not Currently  . Sexual activity: Yes    Birth control/protection: None  Lifestyle  . Physical activity    Days per week: Not on file    Minutes per session: Not on file  . Stress: Not on file  Relationships  . Social Musicianconnections    Talks on phone: Not on file    Gets together: Not on file    Attends religious service: Not on file    Active member of club or organization: Not on file    Attends meetings of clubs or organizations: Not on file    Relationship status: Not on file  Other Topics Concern  . Not on file  Social History Narrative   Married, mother.    Lives in a 43 year old home. Smoking: denies Occupation: Tax inspectorassistant principal  for [employer]  Environmental History: Water Damage/mildew in the house: no Carpet in the family room: no Carpet in the bedroom: yes Heating: gas Cooling: central Pet: yes 1 cat x 1 year  Family History: Family History  Problem Relation Age of Onset  . Uterine cancer Maternal Grandmother   . COPD Maternal Grandmother   . Other Maternal Grandmother        Huntington's disease  . Huntington's disease Mother   . Asthma Mother   . Other Mother        Huntington's disease  . Mental illness Sister        mulitple personality disorder  . Heart disease Maternal Grandfather   . Hyperlipidemia Maternal Grandfather   . Asthma Paternal Grandmother   . Drug abuse Paternal Grandmother   . Heart disease Paternal Grandmother   . Hyperlipidemia Paternal Grandmother   . Kidney  disease Paternal Grandmother   . Alzheimer's disease Paternal Grandfather    Review of Systems  Constitutional: Negative for appetite change, chills, fever and unexpected weight change.  HENT: Negative for congestion and rhinorrhea.   Eyes: Negative for itching.  Respiratory: Negative for cough, chest tightness, shortness of breath and wheezing.   Cardiovascular: Negative for chest pain.  Gastrointestinal: Negative for abdominal pain.  Genitourinary: Negative for difficulty urinating.  Skin: Negative for rash.  Allergic/Immunologic: Positive for environmental allergies. Negative for food allergies.  Neurological: Negative for headaches.   Objective: BP 110/82 (BP Location: Left Arm, Patient Position: Sitting, Cuff Size: Normal)   Pulse 80   Temp 97.6 F (36.4 C) (Temporal)   Resp 16   Ht 5' 2.5" (1.588 m)   Wt 116 lb 3.2 oz (52.7 kg)   SpO2 100%   BMI 20.91 kg/m  Body mass index is 20.91 kg/m. Physical Exam  Constitutional: She is oriented to person, place, and time. She appears well-developed and well-nourished.  HENT:  Head: Normocephalic and atraumatic.  Right Ear: External ear normal.  Left Ear: External ear normal.  Nose: Nose normal.  Mouth/Throat: Oropharynx is clear and moist.  Eyes: Conjunctivae and EOM are normal.  Neck: Neck supple.  Cardiovascular: Normal rate, regular rhythm and normal heart sounds. Exam reveals no gallop and no friction rub.  No murmur heard. Pulmonary/Chest: Effort normal and breath sounds normal. She has no wheezes. She has no rales.  Abdominal: Soft.  Neurological: She is alert and oriented to person, place, and time.  Skin: Skin is warm. No rash noted.  Psychiatric: She has a normal mood and affect. Her behavior is normal.  Nursing note and vitals reviewed.  The plan was reviewed with the patient/family, and all questions/concerned were addressed.  It was my pleasure to see Nichole Crawford today and participate in her care. Please feel free  to contact me with any questions or concerns.  Sincerely,  Rexene Alberts, DO Allergy & Immunology  [facility] office: [phone] [facility] office: [place] office: [phone]

## 2019-02-07 NOTE — Assessment & Plan Note (Signed)
One week history of rash on the forearms b/l in Feb/March 2020. Complained of SOB, chest burning, exhaustion and headaches 1 week prior to rash onset. Describes rash as red and bumpy. Not pruritic. No previous history of rash like this. Denies any changes in medications, diet, or personal care products.  Discussed with patient it is difficult to decipher what may have caused the rash given her history. As the rash resolved without any recurrence there is no indication for any work up at this time.  If rash reoccurs, take pictures and let us know.

## 2019-02-07 NOTE — Patient Instructions (Addendum)
Schedule for penicillin skin testing/drug challenge.  Plan on being here for 2-3 hours.  Must be off antihistamines for 3-5 days before. Bring the medication with you for the appointment  Regarding the rash, take pictures and let us know if it returns. No work up at this time.  Follow up for drug challenge.

## 2019-02-28 ENCOUNTER — Telehealth: Payer: Self-pay | Admitting: Allergy

## 2019-02-28 MED ORDER — AMOXICILLIN 250 MG/5ML PO SUSR: ORAL | 0 refills | Status: DC

## 2019-02-28 NOTE — Telephone Encounter (Signed)
Please call patient. I sent in Rx for amoxicillin for the drug challenge this Wednesday.  Make sure she picks it up and brings to office. Do not take at home. No antihistamines for 3 days before challenge.  Must be feeling well and no fevers for this visit.   Plan on being here for 2-3 hours.

## 2019-02-28 NOTE — Telephone Encounter (Signed)
Patient notified and will follow instructions for challenge.

## 2019-03-02 ENCOUNTER — Ambulatory Visit: Payer: BC Managed Care – PPO | Admitting: Allergy

## 2019-03-02 ENCOUNTER — Other Ambulatory Visit: Payer: Self-pay

## 2019-03-02 ENCOUNTER — Encounter: Payer: Self-pay | Admitting: Allergy

## 2019-03-02 VITALS — BP 98/76 | HR 71 | Temp 98.0°F

## 2019-03-02 DIAGNOSIS — J3089 Other allergic rhinitis: Secondary | ICD-10-CM

## 2019-03-02 DIAGNOSIS — Z881 Allergy status to other antibiotic agents status: Secondary | ICD-10-CM | POA: Diagnosis not present

## 2019-03-02 DIAGNOSIS — R21 Rash and other nonspecific skin eruption: Secondary | ICD-10-CM

## 2019-03-02 DIAGNOSIS — T50905A Adverse effect of unspecified drugs, medicaments and biological substances, initial encounter: Secondary | ICD-10-CM | POA: Insufficient documentation

## 2019-03-02 DIAGNOSIS — T50905D Adverse effect of unspecified drugs, medicaments and biological substances, subsequent encounter: Secondary | ICD-10-CM

## 2019-03-02 NOTE — Patient Instructions (Signed)
For next 24 hours monitor for hives, swelling, shortness of breath and dizziness. If you see these symptoms, use Benadryl for mild symptoms and epinephrine for more severe symptoms and call 911.  If no adverse symptoms in the next 24 hours then will take off drug from allergy list.  Schedule for azithromycin challenge next.  You must be off antihistamines for 3-5 days before. Plan on being in the office for 2-3 hours and must bring in the drug. Will send in a few days before. You must call to scheduled an appointment and specify it's for a drug challenge.

## 2019-03-02 NOTE — Assessment & Plan Note (Addendum)
Past history - Concerned about antibiotic allergy list on her chart. Recently her cat scratched her and had to take bactrim and flagyl for this. Penicillin caused hives as a baby. Azithromycin gave her an abdominal rash. Tolerated previously with no issues. Doxycycline and Levaquin caused brain fog and did not feel well on it.  Interim history   Negative skin prick and intradermal testing today.  Patient passed oral amoxicillin challenge in the office.   If no adverse symptoms in the next 24 hours then will take off drug from allergy list.  She may take penicillin type antibiotics in the future.   Patient's risk of developing penicillin allergy in the future is the same as the general population's.   Schedule for azithromycin challenge next.

## 2019-03-02 NOTE — Progress Notes (Signed)
Follow Up Note  RE: Nichole Crawford MRN: [id number] DOB: 07-03-1975 Date of Office Visit: 03/02/2019  Referring provider: Allegra GranaArnett, Nichole G, FNP Primary care provider: Allegra GranaArnett, Nichole G, FNP  Chief Complaint: [name]  Assessment and Plan: Nichole Crawford is a 43 y.o. female with: Allergy to multiple antibiotics Past history - Concerned about antibiotic allergy list on her chart. Recently her cat scratched her and had to take bactrim and flagyl for this. Penicillin caused hives as a baby. Azithromycin gave her an abdominal rash. Tolerated previously with no issues. Doxycycline and Levaquin caused brain fog and did not feel well on it.  Interim history   Negative skin prick and intradermal testing today.  Patient passed oral amoxicillin challenge in the office.   If no adverse symptoms in the next 24 hours then will take off drug from allergy list.  She may take penicillin type antibiotics in the future.   Patient's risk of developing penicillin allergy in the future is the same as the general population's.   Schedule for azithromycin challenge next.   Return in about 4 weeks (around 03/30/2019) for Drug challenge.  Plan: Challenge drug: Amoxicillin Challenge as per protocol: Passed Total time: 90 minutes  For next 24 hours monitor for hives, swelling, shortness of breath and dizziness. If you see these symptoms, use Benadryl for mild symptoms and epinephrine for more severe symptoms and call 911.  If no adverse symptoms in the next 24 hours then will take off drug from allergy list.  History of Present Illness: I had the pleasure of seeing Nichole Crawford for a follow up visit at the [facility] on 03/03/2019. She is a 43 y.o. female, who is being followed for rash, multiple antibiotic allergies and allergic rhinitis. Today she is here for amoxicillin drug testing and challenge. Her previous allergy office visit was on 02/07/2019 with Dr. Selena Crawford.    History of Reaction: Penicillin - patient broke out in a rash as a baby after taking it for an ear infection.  Denies any sloughing of the skin or how many doses she took before symptoms onset. Not sure how long the rash lasted either.  Since then she has been avoiding it with no issues.   Azithromycin -  Patient was given azithromycin for ? Sinus infection but rash could have been from a virus. This happened around 2006. No issues previously. Not sure how many doses she took before she had the rash. The rash was very minimal on her abdominal area. Not sure how long it lasted for.   Interval History: Patient has not been ill, she has not had any accidental exposures to the culprit medication.   Recent/Current History: Pulmonary disease: no Cardiac disease: no Respiratory infection: no Rash: no Itch: no Swelling: no Cough: no Shortness of breath: no Runny/stuffy nose: no Itchy eyes: no Beta-blocker use: no  Patient/guardian was informed of the test procedure with verbalized understanding of the risk of anaphylaxis.   Last antihistamine use: none in the past 3 days Last beta-blocker use: n/a  Medication List:  Current Outpatient Medications  Medication Sig Dispense Refill  . amoxicillin (AMOXIL) 250 MG/5ML suspension Do NOT take at home. Bring to Dr. Elmyra RicksKim's office for drug challenge. 150 mL 0  . CALCIUM-MAGNESIUM-VITAMIN D PO Take by mouth. Powder. One scoop every other week.    . Multiple Vitamins-Minerals (EMERGEN-C IMMUNE PLUS) PACK Take by mouth.    . Probiotic Product (PROBIOTIC PO) Take 1 capsule  by mouth daily.     No current facility-administered medications for this visit.    Allergies: Allergies  Allergen Reactions  . Azithromycin   . Doxycycline   . Levaquin [Levofloxacin In D5w]    I reviewed her past medical history, social history, family history, and environmental history and no significant changes have been reported from previous visit on 02/07/2019.   Review of Systems  Constitutional: Negative for appetite change, chills, fever and unexpected weight change.  HENT: Negative for congestion and rhinorrhea.   Eyes: Negative for itching.  Respiratory: Negative for cough, chest tightness, shortness of breath and wheezing.   Cardiovascular: Negative for chest pain.  Gastrointestinal: Negative for abdominal pain.  Genitourinary: Negative for difficulty urinating.  Skin: Negative for rash.  Allergic/Immunologic: Positive for environmental allergies. Negative for food allergies.  Neurological: Negative for headaches.   Objective: BP 98/76   Pulse 71   Temp 98 F (36.7 C) (Temporal)   SpO2 99%  There is no height or weight on file to calculate BMI. Physical Exam  Constitutional: She is oriented to person, place, and time. She appears well-developed and well-nourished.  HENT:  Head: Normocephalic and atraumatic.  Right Ear: External ear normal.  Left Ear: External ear normal.  Nose: Nose normal.  Mouth/Throat: Oropharynx is clear and moist.  Eyes: Conjunctivae and EOM are normal.  Neck: Neck supple.  Cardiovascular: Normal rate, regular rhythm and normal heart sounds. Exam reveals no gallop and no friction rub.  No murmur heard. Pulmonary/Chest: Effort normal and breath sounds normal. She has no wheezes. She has no rales.  Abdominal: Soft.  Neurological: She is alert and oriented to person, place, and time.  Skin: Skin is warm. No rash noted.  Psychiatric: She has a normal mood and affect. Her behavior is normal.  Nursing note and vitals reviewed.   Diagnostics: Skin Testing: see below. Negative test to: prepen and pen G.  During intradermal testing glycerin control was used which usually produced a reaction. Patient is needle phobic and decided to not perform a negative control for intradermals.   Results discussed with patient/family. Oral Challenge - 03/02/19 1100    Challenge Food/Drug  Penicillin    Lot #  if Applicable   [id number]    Food/Drug provided by  Patient    BP  98/76    Pulse  71    Respirations  16    Lungs  clear    Skin  clear    Mouth  clear    Time  1040    Dose  1 mL    BP  104/60    Pulse  63    Respirations  16    Lungs  clear    Skin  clear    Mouth  clear    Additional Dose  Yes    Time  1111    Dose  10 mL    BP  96/60    Pulse  64    Respirations  16    Lungs  clear    Skin  clear    Mouth  clear    Additional Dose  No     Penicillin - 03/02/19 0930    Manufacturer  [employer]    Lot #  [id number]    Location  Arm    Number of allergen test  8    Time Testing Placed  0935    Control SPT  Negative    Histamine SPT  2+  Pre-Pen Puncture  Negative    Penicillin-G 5000 u/ml SPT  Negative    Select  Select    Control Intradermal  --   2+ glycerin used   Pre-Pen Intradermal  Negative    Time Testing Placed  0953    Penicillin-G 500u/ml Intradermal  Negative    Time Testing Placed  1015    Penicillin-G 5000 u/ml Intradermal  Negative       Previous notes and tests were reviewed. The plan was reviewed with the patient/family, and all questions/concerned were addressed.  It was my pleasure to see Bevan today and participate in her care. Please feel free to contact me with any questions or concerns.  Sincerely,  Rexene Alberts, DO Allergy & Immunology  [facility] office: [phone] [place] office: [phone]

## 2019-03-03 ENCOUNTER — Telehealth: Payer: Self-pay | Admitting: Allergy

## 2019-03-03 NOTE — Telephone Encounter (Signed)
I called the patient to change her appointment, with Dr. Maudie Mercury, on 04-04-19, to a azithromycin challenge. She will just need this medication called in to [facility] on [address] in [place].

## 2019-03-03 NOTE — Telephone Encounter (Signed)
Dr Maudie Mercury sent the medication in on 02/28/19. Called patient and left a voicemail advising to call back to confirm if she picked up medication or to make aware that we have sent in the medication.

## 2019-03-04 NOTE — Telephone Encounter (Signed)
Spoke with patient and informed her script was sent into the pharmacy but not to pick it up until closer to appointment date. Patient states she will pick it up the day before.

## 2019-04-03 ENCOUNTER — Telehealth: Payer: Self-pay | Admitting: Allergy & Immunology

## 2019-04-03 NOTE — Telephone Encounter (Signed)
I received a call the pharmacy reporting that Nichole Crawford was at the pharmacy and needed a "medication for something tomorrow" at our office. I looked her up and saw that she was scheduled for an azithromycin challenge on Monday October 19th.  I asked the pharmacist to dispense two 250mg  tablets of azithromycin for the challenge tomorrow.   Salvatore Marvel, MD [facility]

## 2019-04-04 ENCOUNTER — Encounter: Payer: Self-pay | Admitting: Allergy

## 2019-04-04 ENCOUNTER — Ambulatory Visit: Payer: BC Managed Care – PPO | Admitting: Allergy

## 2019-04-04 ENCOUNTER — Other Ambulatory Visit: Payer: Self-pay

## 2019-04-04 VITALS — BP 102/64 | HR 61 | Temp 97.3°F | Resp 16

## 2019-04-04 DIAGNOSIS — Z881 Allergy status to other antibiotic agents status: Secondary | ICD-10-CM | POA: Diagnosis not present

## 2019-04-04 DIAGNOSIS — J3089 Other allergic rhinitis: Secondary | ICD-10-CM

## 2019-04-04 DIAGNOSIS — R21 Rash and other nonspecific skin eruption: Secondary | ICD-10-CM

## 2019-04-04 NOTE — Assessment & Plan Note (Signed)
Past history - Skin testing in 2005 was positive to pollen per patient report. Underwent 1 year of AIT with good benefit which decreased her sinus/ear infections. Currently has minimal symptoms for which she takes homeopathic sinus medication and Flonase prn with good benefit. Interim history - Stable.   Monitor symptoms.  May use over the counter antihistamines such as Zyrtec (cetirizine), Claritin (loratadine), Allegra (fexofenadine), or Xyzal (levocetirizine) daily as needed.  May take Flonase 1 spray per nostril 1-2 times a day as needed for sinus symptoms.

## 2019-04-04 NOTE — Assessment & Plan Note (Signed)
Past history - One week history of rash on the forearms b/l in Feb/March 2020. Complained of SOB, chest burning, exhaustion and headaches 1 week prior to rash onset. Describes rash as red and bumpy. Not pruritic. No previous history of rash like this. Denies any changes in medications, diet, or personal care products. Interim history - No additional rashes.   If you do end up breaking out rashes - take pictures and write down what you had eaten/come in contact with in the last 6-8 hours.

## 2019-04-04 NOTE — Assessment & Plan Note (Addendum)
Past history - Concerned about antibiotic allergy list on her chart. Recently her cat scratched her and had to take bactrim and flagyl for this. Penicillin caused hives as a baby. Azithromycin gave her an abdominal rash. Tolerated previously with no issues. Doxycycline and Levaquin caused brain fog and did not feel well on it. Passed amoxicillin drug challenge in 2020.  Interim history - No additional reactions.   Negative skin prick test to azithromycin dilution.   Patient passed oral azithromycin challenge in the office. Vitals stable throughout.   If no adverse symptoms in the next 24 hours then will take off drug from allergy list.  She may take azithromycin antibiotics in the future.   Patient's risk of developing azithromycin allergy in the future is the same as the general population's.   Regarding the doxycycline and Levaquin - these reactions were most likely not allergic but side effect of medications.

## 2019-04-04 NOTE — Patient Instructions (Addendum)
For next 24 hours monitor for hives, swelling, shortness of breath and dizziness. If you see these symptoms, use Benadryl for mild symptoms and epinephrine for more severe symptoms and call 911.  If no adverse symptoms in the next 24 hours then will take off drug from allergy list.  Regarding the doxycycline and Levaquin - I will put a statement saying that your symptoms were most likely not allergic but side effect of medication.   If you do end up breaking out rashes - take pictures and write down what you had eaten/come in contact with in the last 6-8 hours.   Other allergic rhinitis  Continue environmental control measures for pollen.   May use over the counter antihistamines such as Zyrtec (cetirizine), Claritin (loratadine), Allegra (fexofenadine), or Xyzal (levocetirizine) daily as needed.  May take Flonase 1 spray per nostril 1-2 times a day as needed for sinus symptoms.    Follow up as needed.

## 2019-04-04 NOTE — Progress Notes (Signed)
Follow Up Note  RE: Verdis Primelicia M Elie MRN: [id number] DOB: 14-Jul-1975 Date of Office Visit: 04/04/2019  Referring provider: Allegra GranaArnett, Margaret G, FNP Primary care provider: Allegra GranaArnett, Margaret G, FNP  Chief Complaint: Si RaiderFood/Drug Challenge (Azithromycin)  Assessment and Plan: [name] is a 43 y.o. female with: Allergy to multiple antibiotics Past history - Concerned about antibiotic allergy list on her chart. Recently her cat scratched her and had to take bactrim and flagyl for this. Penicillin caused hives as a baby. Azithromycin gave her an abdominal rash. Tolerated previously with no issues. Doxycycline and Levaquin caused brain fog and did not feel well on it. Passed amoxicillin drug challenge in 2020.  Interim history - No additional reactions.   Negative skin prick test to azithromycin dilution.   Patient passed oral azithromycin challenge in the office. Vitals stable throughout.   If no adverse symptoms in the next 24 hours then will take off drug from allergy list.  She may take azithromycin antibiotics in the future.   Patient's risk of developing azithromycin allergy in the future is the same as the general population's.   Regarding the doxycycline and Levaquin - these reactions were most likely not allergic but side effect of medications.   Rash and other nonspecific skin eruption Past history - One week history of rash on the forearms b/l in Feb/March 2020. Complained of SOB, chest burning, exhaustion and headaches 1 week prior to rash onset. Describes rash as red and bumpy. Not pruritic. No previous history of rash like this. Denies any changes in medications, diet, or personal care products. Interim history - No additional rashes.   If you do end up breaking out rashes - take pictures and write down what you had eaten/come in contact with in the last 6-8 hours.    Other allergic rhinitis Past history - Skin testing in 2005 was positive to pollen per patient report. Underwent 1  year of AIT with good benefit which decreased her sinus/ear infections. Currently has minimal symptoms for which she takes homeopathic sinus medication and Flonase prn with good benefit. Interim history - Stable.   Monitor symptoms.  May use over the counter antihistamines such as Zyrtec (cetirizine), Claritin (loratadine), Allegra (fexofenadine), or Xyzal (levocetirizine) daily as needed.  May take Flonase 1 spray per nostril 1-2 times a day as needed for sinus symptoms.   Return if symptoms worsen or fail to improve.  Plan: Challenge drug: Azithromycin Challenge as per protocol: Passed Total time: 120 minutes  History of Present Illness: I had the pleasure of seeing Nichole Crawford for a follow up visit at the [facility] on 04/04/2019. She is a 43 y.o. female, who is being followed for multiple drug allergies and allergic rhinitis. Today she is here for azithromycin drug testing and challenge. Her previous allergy office visit was on 03/02/2019 with Dr. Selena BattenKim.   History of Reaction: Patient was given azithromycin for sinus infection but rash could have been from the viral infection she had. This happened around 2007. No issues previously. She took at least 2-3 doses before the rash appear. The rash was very minimal on her abdominal area. No other associated symptoms. Not sure how long it lasted for.   Interval History: Patient has not been ill, she has not had any accidental exposures to the culprit medication.   Recent/Current History: Pulmonary disease: no Cardiac disease: no Respiratory infection: no Rash: no Itch: no Swelling: no Cough: no Shortness of breath: no Runny/stuffy nose: no  Itchy eyes: yes slightly Beta-blocker use: no  Patient/guardian was informed of the test procedure with verbalized understanding of the risk of anaphylaxis.   Last antihistamine use: more than 3 days ago Last beta-blocker use: n/a  Medication List:  Current Outpatient  Medications  Medication Sig Dispense Refill  . amoxicillin (AMOXIL) 250 MG/5ML suspension Do NOT take at home. Bring to Dr. Julianne Rice office for drug challenge. 150 mL 0  . CALCIUM-MAGNESIUM-VITAMIN D PO Take by mouth. Powder. One scoop every other week.    . Multiple Vitamins-Minerals (EMERGEN-C IMMUNE PLUS) PACK Take by mouth.    . Probiotic Product (PROBIOTIC PO) Take 1 capsule by mouth daily.     No current facility-administered medications for this visit.    Allergies: Allergies  Allergen Reactions  . Doxycycline Other (See Comments)    Brain fog, not feeling well - most likely non IgE mediated side effect of medication.  [place] [Levofloxacin In D5w] Other (See Comments)    Brain fog, not feeling well - most likely non IgE mediated side effect of medication.   I reviewed her past medical history, social history, family history, and environmental history and no significant changes have been reported from her previous visit.  Review of Systems  Constitutional: Negative for appetite change, chills, fever and unexpected weight change.  HENT: Negative for congestion and rhinorrhea.   Eyes: Negative for itching.  Respiratory: Negative for cough, chest tightness, shortness of breath and wheezing.   Cardiovascular: Negative for chest pain.  Gastrointestinal: Negative for abdominal pain.  Genitourinary: Negative for difficulty urinating.  Skin: Negative for rash.  Allergic/Immunologic: Positive for environmental allergies. Negative for food allergies.  Neurological: Negative for headaches.   Objective: BP 102/64 (BP Location: Right Arm, Patient Position: Sitting, Cuff Size: Normal) Comment: Post Vitals  Pulse 61   Temp (!) 97.3 F (36.3 C) (Temporal)   Resp 16   SpO2 97%  There is no height or weight on file to calculate BMI. Physical Exam  Constitutional: She is oriented to person, place, and time. She appears well-developed and well-nourished.  HENT:  Head: Normocephalic and  atraumatic.  Right Ear: External ear normal.  Left Ear: External ear normal.  Nose: Nose normal.  Mouth/Throat: Oropharynx is clear and moist.  Eyes: Conjunctivae and EOM are normal.  Neck: Neck supple.  Cardiovascular: Normal rate, regular rhythm and normal heart sounds. Exam reveals no gallop and no friction rub.  No murmur heard. Pulmonary/Chest: Effort normal and breath sounds normal. She has no wheezes. She has no rales.  Abdominal: Soft.  Neurological: She is alert and oriented to person, place, and time.  Skin: Skin is warm. No rash noted.  Psychiatric: She has a normal mood and affect. Her behavior is normal.  Nursing note and vitals reviewed.   Diagnostics: Skin Testing: azithromycin, negative, positive control. Negative test to: azithromycin.  Results discussed with patient/family. Oral Challenge - 04/04/19 0900    Challenge Food/Drug  Azithromycin    Food/Drug provided by  Patient    BP  100/68    Pulse  67    Respirations  16    Lungs  Clear    Skin  Clear    Mouth  Clear    Time  0943    Dose  Skin Test    Lungs  Clear    Skin  Clear    Mouth  Clear    Comments  Control: Negative, Histamine: 2+, Azithromycin= Negative    Time  1002  Dose  9.88mg     Lungs  Clear    Skin  Clear    Mouth  Clear    Time  1018    Dose  31.25mg     Lungs  Clear    Skin  Clear    Mouth  Clear    Time  1035    Dose  62.50mg     Time  1052    Dose  375mg     BP  108/66    Pulse  60    Respirations  18    Lungs  Clear    Skin  Clear    Mouth  Clear       Previous notes and tests were reviewed. The plan was reviewed with the patient/family, and all questions/concerned were addressed.  It was my pleasure to see Estera today and participate in her care. Please feel free to contact me with any questions or concerns.  Sincerely,  Constance Holster, DO Allergy & Immunology  [facility] office: [phone] [facility]  office:[phone] [place] office: [phone]

## 2020-01-22 ENCOUNTER — Encounter: Payer: Self-pay | Admitting: Family

## 2020-01-23 ENCOUNTER — Encounter: Payer: Self-pay | Admitting: Family

## 2020-01-23 ENCOUNTER — Telehealth (INDEPENDENT_AMBULATORY_CARE_PROVIDER_SITE_OTHER): Payer: BC Managed Care – PPO | Admitting: Family

## 2020-01-23 DIAGNOSIS — Z7185 Encounter for immunization safety counseling: Secondary | ICD-10-CM

## 2020-01-23 DIAGNOSIS — Z7189 Other specified counseling: Secondary | ICD-10-CM | POA: Diagnosis not present

## 2020-01-23 NOTE — Telephone Encounter (Signed)
Pt does not want an appt. She just needs someone to call her back and answer a question about the vaccine. She is worried over some family hx symptoms? Pt refuses appt for this. Please advise before vaccine appt tomorrow

## 2020-01-23 NOTE — Telephone Encounter (Addendum)
Left a voicemail and sent a mychart message to call back and schedule a Telephone Call to discuss her covid questions with Rennie Plowman.

## 2020-01-23 NOTE — Progress Notes (Signed)
Verbal consent for services obtained from patient prior to services given to TELEPHONE visit:   Location of call:  provider at work patient at home  Names of all persons present for services: Rennie Plowman, NP and patient Chief complaint:  She has many questions about vaccine.  Worried about getting side effect or long term effect from vaccine. In particular, worries about DVT. She worries about how eliminated from body. She wants to know how to prepare today vaccine and what to do if she has side effects.  Wanted to discuss COVID vaccine and scheduled for pfizer COVID vaccine  tomorrow.  With the flu vaccine, many years ago,   'I always catch the flu'. She never had fever, SOB, trouble breathing, rash with any injectable medication or vaccine.  She hasnt gotten the flu vaccine in many years as she felt unwell when she had had it. She doesn't recall a particular symptom.  Never had a dvt. No known clotting disorders. Non smoker. 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 Husband and son are both Covid vaccinated   No known allergy to PEG, never had colonoscopy.  Consult with allergy Passed amoxicillin, azithromycin challenge 2020 levaquin and doxycycline side effects.  A/P/next steps: Problem List Items Addressed This Visit      Other   Vaccine counseling    Advised patient she doesn't have apparent contraindications to receive mrna covid vaccine. Reassured her that we have do not reason to worry about DVT with Pfizer vaccine at this time. Advised her to get pfizer covid vaccination due to her risk of contracting disease due to exposure with unvaccinated elementary age children. Advised to remain in vaccine room for at least 30 minutes and certainly if she has any sob, trouble breathing to go to ED. She will let me know of any further questions.All questions answered to the best of my ability and she seemed very pleased.          I spent 25 min  discussing plan  of care over the phone.

## 2020-01-23 NOTE — Assessment & Plan Note (Addendum)
Advised patient she doesn't have apparent contraindications to receive mrna covid vaccine. Reassured her that we have do not reason to worry about DVT with Pfizer vaccine at this time. Advised her to get pfizer covid vaccination due to her risk of contracting disease due to exposure with unvaccinated elementary age children. Advised to remain in vaccine room for at least 30 minutes and certainly if she has any sob, trouble breathing to go to ED. She will let me know of any further questions.All questions answered to the best of my ability and she seemed very pleased.

## 2020-01-24 ENCOUNTER — Telehealth: Payer: Self-pay | Admitting: Family

## 2020-01-24 NOTE — Telephone Encounter (Signed)
Spoke on phone with patient she was very upset wanted me to tell her she is not having a reaction to vaccine stating she has tingling to the left side of her face and that is the side she has injection of Pfizer vaccine today approximately one hour ago, advised that I did not think was an allergic reaction but no way to be a 100 % sure due to  I could not see patient. Patient became more excited and stated my questions is am I having a reaction, I stated I cannot tell over phone and she knows her body better than anyone if she is feeling something different then she needs to be evaluated at Urgent care patient advised she would have some one drive her to UC.

## 2020-01-24 NOTE — Telephone Encounter (Signed)
Pt called and states that she received Pfizer Covid vaccine on 01/23/20. She states that the inside of her cheek feels numb and she wanted to know if this was normal. I transferred to [facility]

## 2020-01-25 ENCOUNTER — Ambulatory Visit
Admission: EM | Admit: 2020-01-25 | Discharge: 2020-01-25 | Disposition: A | Discharge status: Home / Self Care | Payer: BC Managed Care – PPO

## 2020-01-25 DIAGNOSIS — R202 Paresthesia of skin: Secondary | ICD-10-CM

## 2020-01-25 NOTE — Discharge Instructions (Signed)
Follow up with your primary care provider if your symptoms are not improving.    Call 911 or go to the emergency department if you have difficulty swallowing or breathing.

## 2020-01-25 NOTE — Telephone Encounter (Signed)
Pt called back wanting to speak to Olegario Messier again about what was discussed yesterday

## 2020-01-25 NOTE — Telephone Encounter (Signed)
Called and spoke with patient she is still concerned she is having some kind of reaction to the COVID vaccine ad her she could call [facility] and ask question but ultimately she needs to be eluated for her safety. Patient  Is going to [facility].

## 2020-01-25 NOTE — ED Provider Notes (Signed)
Renaldo Fiddler    CSN: [id number] Arrival date & time: 01/25/20  1309      History   Chief Complaint Chief Complaint  Patient presents with  . vaccine reaction    HPI APRILL BANKO is a 44 y.o. female.   Patient presents with tingling on the left side of her face, her left arm, her upper back since yesterday.  She states her symptoms started after receiving her first COVID vaccine.  She states the tingling started in her tongue but this has resolved.  No treatments attempted at home.  [facility] and was [facility] afterward.  She denies difficulty breathing or swallowing.  She reports she feels anxious about having received the COVID shot.  She states she did not want to take it but felt peer pressure; she is concerned that she will have permanent neurological damage from the vaccine.  She denies fever, chills, cough, shortness of breath, abdominal pain, rash, or other symptoms.  The history is provided by the patient.    Past Medical History:  Diagnosis Date  . Abnormal Pap smear of cervix   . Allergy    Hay fever/seasonal allergies   . Chicken pox   . Migraines   . Urticaria     Patient Active Problem List   Diagnosis Date Noted  . Vaccine counseling 01/23/2020  . Drug reaction 03/02/2019  . Allergy to multiple antibiotics 02/07/2019  . Rash and other nonspecific skin eruption 02/07/2019  . Other allergic rhinitis 02/07/2019  . Breast tenderness 12/03/2018  . Cat scratch 11/29/2018  . Nonintractable headache 11/12/2018  . Hives 08/25/2018  . Encounter for medical examination to establish care 08/25/2018    History reviewed. No pertinent surgical history.  OB History    Gravida  4   Para  3   Term  3   Preterm      AB  1   Living  3     SAB      TAB      Ectopic      Multiple      Live Births  3            Home Medications    Prior to Admission medications   Medication Sig Start  Date End Date Taking? Authorizing Provider  CALCIUM-MAGNESIUM-VITAMIN D PO Take by mouth. Powder. One scoop every other week.    [provider]  Multiple Vitamins-Minerals (EMERGEN-C IMMUNE PLUS) PACK Take by mouth.    [provider]  Probiotic Product (PROBIOTIC PO) Take 1 capsule by mouth daily.    [provider]    Family History Family History  Problem Relation Age of Onset  . Uterine cancer Maternal Grandmother   . COPD Maternal Grandmother   . Other Maternal Grandmother        Huntington's disease  . Huntington's disease Mother   . Asthma Mother   . Other Mother        Huntington's disease  . Mental illness Sister        mulitple personality disorder  . Heart disease Maternal Grandfather   . Hyperlipidemia Maternal Grandfather   . Asthma Paternal Grandmother   . Drug abuse Paternal Grandmother   . Heart disease Paternal Grandmother   . Hyperlipidemia Paternal Grandmother   . Kidney disease Paternal Grandmother   . Alzheimer's disease Paternal Grandfather     Social History Social History   Tobacco Use  .  Smoking status: Never Smoker  . Smokeless tobacco: Never Used  Vaping Use  . Vaping Use: Never used  Substance Use Topics  . Alcohol use: Yes  . Drug use: Not Currently     Allergies   Doxycycline, Levaquin [levofloxacin in d5w], and Multivitamins   Review of Systems Review of Systems  Constitutional: Negative for chills and fever.  HENT: Negative for ear pain, sore throat and trouble swallowing.   Eyes: Negative for pain and visual disturbance.  Respiratory: Negative for cough and shortness of breath.   Cardiovascular: Negative for chest pain and palpitations.  Gastrointestinal: Negative for abdominal pain, diarrhea and vomiting.  Genitourinary: Negative for dysuria and hematuria.  Musculoskeletal: Negative for arthralgias and back pain.  Skin: Negative for color change and rash.  Neurological: Negative for seizures,  syncope, weakness and numbness.       Tingling of face, arm, back.  Psychiatric/Behavioral: The patient is nervous/anxious.   All other systems reviewed and are negative.    Physical Exam Triage Vital Signs ED Triage Vitals  Enc Vitals Group     BP 01/25/20 1318 122/84     Pulse Rate 01/25/20 1318 92     Resp 01/25/20 1318 16     Temp 01/25/20 1318 98.2 F (36.8 C)     Temp src --      SpO2 01/25/20 1318 100 %     Weight --      Height --      Head Circumference --      Peak Flow --      Pain Score 01/25/20 1316 0     Pain Loc --      Pain Edu? --      Excl. in GC? --    No data found.  Updated Vital Signs BP 122/84   Pulse 92   Temp 98.2 F (36.8 C)   Resp 16   SpO2 100%   Visual Acuity Right Eye Distance:   Left Eye Distance:   Bilateral Distance:    Right Eye Near:   Left Eye Near:    Bilateral Near:     Physical Exam Vitals and nursing note reviewed.  Constitutional:      Appearance: Normal appearance. She is well-developed. She is not ill-appearing.  HENT:     Head: Normocephalic and atraumatic.     Right Ear: Tympanic membrane normal.     Left Ear: Tympanic membrane normal.     Nose: Nose normal.     Mouth/Throat:     Mouth: Mucous membranes are moist.     Pharynx: Oropharynx is clear.     Comments: Speech clear.  No difficulty swallowing.  Eyes:     Conjunctiva/sclera: Conjunctivae normal.  Cardiovascular:     Rate and Rhythm: Normal rate and regular rhythm.     Heart sounds: Normal heart sounds. No murmur heard.   Pulmonary:     Effort: Pulmonary effort is normal. No respiratory distress.     Breath sounds: No stridor. No wheezing or rhonchi.  Abdominal:     Palpations: Abdomen is soft.     Tenderness: There is no abdominal tenderness. There is no guarding or rebound.  Musculoskeletal:        General: No swelling or deformity. Normal range of motion.     Cervical back: Neck supple.     Right lower leg: No edema.     Left lower leg: No  edema.  Skin:    General: Skin is  warm and dry.     Findings: No rash.  Neurological:     General: No focal deficit present.     Mental Status: She is alert and oriented to person, place, and time.     Cranial Nerves: No cranial nerve deficit.     Sensory: No sensory deficit.     Motor: No weakness.     Coordination: Coordination normal.     Gait: Gait normal.  Psychiatric:     Comments: Intermittent tearfulness during exam.       UC Treatments / Results  Labs (all labs ordered are listed, but only abnormal results are displayed) Labs Reviewed - No data to display  EKG   Radiology No results found.  Procedures Procedures (including critical care time)  Medications Ordered in UC Medications - No data to display  Initial Impression / Assessment and Plan / UC Course  I have reviewed the triage vital signs and the nursing notes.  Pertinent labs & imaging results that were available during my care of the patient were reviewed by me and considered in my medical decision making (see chart for details).   Facial tingling sensation.  Left arm tingling sensation.  Patient is well-appearing and her exam is reassuring.  Instructed her to follow-up with her PCP if her symptoms are not improving.  Instructed her to call 911 or go to the ED if she has difficulty swallowing or breathing.  Patient agrees to plan of care.   Final Clinical Impressions(s) / UC Diagnoses   Final diagnoses:  Facial tingling sensation  Left face and left arm tingling     Discharge Instructions     Follow up with your primary care provider if your symptoms are not improving.    Call 911 or go to the emergency department if you have difficulty swallowing or breathing.        ED Prescriptions    None     PDMP not reviewed this encounter.   Mickie Bail, NP 01/25/20 1355

## 2020-01-25 NOTE — ED Triage Notes (Signed)
Patient reports she got her first covid vaccine yesterday. States she is experiencing tingling in her back and tongue.

## 2020-01-25 NOTE — Telephone Encounter (Signed)
Noted In ed 

## 2020-01-27 ENCOUNTER — Encounter: Payer: Self-pay | Admitting: Internal Medicine

## 2020-01-27 ENCOUNTER — Telehealth (INDEPENDENT_AMBULATORY_CARE_PROVIDER_SITE_OTHER): Payer: BC Managed Care – PPO | Admitting: Internal Medicine

## 2020-01-27 VITALS — Ht 62.5 in | Wt 112.0 lb

## 2020-01-27 DIAGNOSIS — R202 Paresthesia of skin: Secondary | ICD-10-CM

## 2020-01-27 DIAGNOSIS — Z0000 Encounter for general adult medical examination without abnormal findings: Secondary | ICD-10-CM | POA: Diagnosis not present

## 2020-01-27 DIAGNOSIS — T50Z95A Adverse effect of other vaccines and biological substances, initial encounter: Secondary | ICD-10-CM

## 2020-01-27 DIAGNOSIS — Z1329 Encounter for screening for other suspected endocrine disorder: Secondary | ICD-10-CM

## 2020-01-27 DIAGNOSIS — T148XXA Other injury of unspecified body region, initial encounter: Secondary | ICD-10-CM

## 2020-01-27 DIAGNOSIS — Z1389 Encounter for screening for other disorder: Secondary | ICD-10-CM

## 2020-01-27 DIAGNOSIS — F419 Anxiety disorder, unspecified: Secondary | ICD-10-CM

## 2020-01-27 DIAGNOSIS — E538 Deficiency of other specified B group vitamins: Secondary | ICD-10-CM

## 2020-01-27 DIAGNOSIS — E559 Vitamin D deficiency, unspecified: Secondary | ICD-10-CM

## 2020-01-27 MED ORDER — PREDNISONE 10 MG PO TABS: 10.0000 mg | ORAL_TABLET | Freq: Every day | ORAL | 0 refills | Status: DC

## 2020-01-27 NOTE — Progress Notes (Signed)
Video  I connected with Nichole Crawford  on 01/27/20 at  1:15 PM EDT by video and verified that I am speaking with the correct person using two identifiers.  Location patient: home Location provider:work or home office Persons participating in the virtual visit: patient, provider  I discussed the limitations of evaluation and management by telemedicine and the availability of in person appointments. The patient expressed understanding and agreed to proceed.   HPI: Pfizer 01/24/20 11:30 left arm had left tongue tingling increased anxiety/panic x 15 minutes drank 2 bottles of water and had 1 hour of monitoring 1:30 or 2 PM left face cheek started to tingling and left side and right arm tried tylenol no help. She went to urgent care 01/25/20 and thought was getting better Thursday but had right arm tingling in the am and feels tired and face hot no fever. Feels like pens and needles left arm and legs and right arm and forehead like all over body. She tried benadryl 25 mg 9:15 and 11 :15 am another 25 mg and today felt like palms red, hot and tight and lips hot and limbs. C/o bruising to legs. A friend of hers gave her epi pen but she has not used feels like having allergic reaction  Anxiety-consider psych/therapy meds in future disc with PCP  ROS: See pertinent positives and negatives per HPI.  Past Medical History:  Diagnosis Date  . Abnormal Pap smear of cervix   . Allergy    Hay fever/seasonal allergies   . Chicken pox   . Migraines   . Urticaria     No past surgical history on file.  Family History  Problem Relation Age of Onset  . Uterine cancer Maternal Grandmother   . COPD Maternal Grandmother   . Other Maternal Grandmother        Huntington's disease  . Huntington's disease Mother   . Asthma Mother   . Other Mother        Huntington's disease  . Mental illness Sister        mulitple personality disorder  . Heart disease Maternal Grandfather   . Hyperlipidemia Maternal  Grandfather   . Asthma Paternal Grandmother   . Drug abuse Paternal Grandmother   . Heart disease Paternal Grandmother   . Hyperlipidemia Paternal Grandmother   . Kidney disease Paternal Grandmother   . Alzheimer's disease Paternal Grandfather     SOCIAL HX:   Married, mother.  Asst. Principal at school   Current Outpatient Medications:  .  CALCIUM-MAGNESIUM-VITAMIN D PO, Take by mouth. Powder. One scoop every other week., Disp: , Rfl:  .  Multiple Vitamins-Minerals (EMERGEN-C IMMUNE PLUS) PACK, Take by mouth., Disp: , Rfl:  .  Probiotic Product (PROBIOTIC PO), Take 1 capsule by mouth daily., Disp: , Rfl:  .  predniSONE (DELTASONE) 10 MG tablet, Take 1 tablet (10 mg total) by mouth daily with breakfast., Disp: 7 tablet, Rfl: 0  EXAM:  VITALS per patient if applicable:  GENERAL: alert, oriented, appears well and in no acute distress  HEENT: atraumatic, conjunttiva clear, no obvious abnormalities on inspection of external nose and ears  NECK: normal movements of the head and neck  LUNGS: on inspection no signs of respiratory distress, breathing rate appears normal, no obvious gross SOB, gasping or wheezing  CV: no obvious cyanosis  MS: moves all visible extremities without noticeable abnormality  PSYCH/NEURO: pleasant and cooperative, no obvious depression or anxiety, speech and thought processing grossly intact  ASSESSMENT AND PLAN:  Discussed the following assessment and plan:  Adverse effect of vaccine, initial encounter - Plan: predniSONE (DELTASONE) 10 MG tablet qam x 1 day, prn benadryl  If tingling does not subside rec neurology MRI/NCS  Tingling - Plan: TSH, T4, free, Vitamin B12, comp labs  predniSONE (DELTASONE) 10 MG tablet qd x 7 days   Bruising - Plan: CBC with Differential/Platelet  Anxiety - Plan: Comprehensive metabolic panel, CBC with Differential/Platelet, TSH, T4, free Consider therapy/psych meds in future   -we discussed possible serious and  likely etiologies, options for evaluation and workup, limitations of telemedicine visit vs in person visit, treatment, treatment risks and precautions. Pt prefers to treat via telemedicine empirically rather then risking or undertaking an in person visit at this moment. Patient agrees to seek prompt in person care if worsening, new symptoms arise, or if is not improving with treatment.   I discussed the assessment and treatment plan with the patient. The patient was provided an opportunity to ask questions and all were answered. The patient agreed with the plan and demonstrated an understanding of the instructions.   The patient was advised to call back or seek an in-person evaluation if the symptoms worsen or if the condition fails to improve as anticipated.  Time spent 20 min Bevelyn Buckles, MD

## 2020-01-30 ENCOUNTER — Other Ambulatory Visit (INDEPENDENT_AMBULATORY_CARE_PROVIDER_SITE_OTHER): Payer: BC Managed Care – PPO

## 2020-01-30 ENCOUNTER — Other Ambulatory Visit: Payer: Self-pay

## 2020-01-30 DIAGNOSIS — Z0000 Encounter for general adult medical examination without abnormal findings: Secondary | ICD-10-CM | POA: Diagnosis not present

## 2020-01-30 DIAGNOSIS — E559 Vitamin D deficiency, unspecified: Secondary | ICD-10-CM

## 2020-01-30 DIAGNOSIS — Z1329 Encounter for screening for other suspected endocrine disorder: Secondary | ICD-10-CM

## 2020-01-30 DIAGNOSIS — E538 Deficiency of other specified B group vitamins: Secondary | ICD-10-CM

## 2020-01-30 DIAGNOSIS — F419 Anxiety disorder, unspecified: Secondary | ICD-10-CM | POA: Diagnosis not present

## 2020-01-30 DIAGNOSIS — R202 Paresthesia of skin: Secondary | ICD-10-CM | POA: Diagnosis not present

## 2020-01-30 DIAGNOSIS — T148XXA Other injury of unspecified body region, initial encounter: Secondary | ICD-10-CM

## 2020-01-30 DIAGNOSIS — Z1389 Encounter for screening for other disorder: Secondary | ICD-10-CM

## 2020-01-30 LAB — COMPREHENSIVE METABOLIC PANEL
ALT: 11 U/L (ref 0–35)
AST: 14 U/L (ref 0–37)
Albumin: 5.1 g/dL (ref 3.5–5.2)
Alkaline Phosphatase: 34 U/L — ABNORMAL LOW (ref 39–117)
BUN: 6 mg/dL (ref 6–23)
CO2: 26 mEq/L (ref 19–32)
Calcium: 10 mg/dL (ref 8.4–10.5)
Chloride: 101 mEq/L (ref 96–112)
Creatinine, Ser: 0.77 mg/dL (ref 0.40–1.20)
GFR: 81.34 mL/min (ref >60.00)
Glucose, Bld: 98 mg/dL (ref 70–99)
Potassium: 4.6 mEq/L (ref 3.5–5.1)
Sodium: 139 mEq/L (ref 135–145)
Total Bilirubin: 0.7 mg/dL (ref 0.2–1.2)
Total Protein: 7.5 g/dL (ref 6.0–8.3)

## 2020-01-30 LAB — CBC WITH DIFFERENTIAL/PLATELET
Basophils Absolute: 0 10*3/uL (ref 0.0–0.1)
Basophils Relative: 0.6 % (ref 0.0–3.0)
Eosinophils Absolute: 0.1 10*3/uL (ref 0.0–0.7)
Eosinophils Relative: 1.1 % (ref 0.0–5.0)
HCT: 42.1 % (ref 36.0–46.0)
Hemoglobin: 14.4 g/dL (ref 12.0–15.0)
Lymphocytes Relative: 23.4 % (ref 12.0–46.0)
Lymphs Abs: 1.6 10*3/uL (ref 0.7–4.0)
MCHC: 34.1 g/dL (ref 30.0–36.0)
MCV: 91.2 fl (ref 78.0–100.0)
Monocytes Absolute: 0.3 10*3/uL (ref 0.1–1.0)
Monocytes Relative: 4.5 % (ref 3.0–12.0)
Neutro Abs: 4.8 10*3/uL (ref 1.4–7.7)
Neutrophils Relative %: 70.4 % (ref 43.0–77.0)
Platelets: 252 10*3/uL (ref 150.0–400.0)
RBC: 4.62 Mil/uL (ref 3.87–5.11)
RDW: 13.3 % (ref 11.5–15.5)
WBC: 6.8 10*3/uL (ref 4.0–10.5)

## 2020-01-30 LAB — LIPID PANEL
Cholesterol: 151 mg/dL (ref 0–200)
HDL: 77.1 mg/dL (ref >39.00)
LDL Cholesterol: 61 mg/dL (ref 0–99)
NonHDL: 74.24
Total CHOL/HDL Ratio: 2
Triglycerides: 65 mg/dL (ref 0.0–149.0)
VLDL: 13 mg/dL (ref 0.0–40.0)

## 2020-01-30 LAB — TSH: TSH: 1.45 u[IU]/mL (ref 0.35–4.50)

## 2020-01-30 LAB — VITAMIN D 25 HYDROXY (VIT D DEFICIENCY, FRACTURES): VITD: 31.74 ng/mL (ref 30.00–100.00)

## 2020-01-30 LAB — T4, FREE: Free T4: 1.45 ng/dL (ref 0.60–1.60)

## 2020-01-30 LAB — VITAMIN B12: Vitamin B-12: 770 pg/mL (ref 211–911)

## 2020-01-31 ENCOUNTER — Telehealth: Payer: Self-pay | Admitting: Family

## 2020-01-31 ENCOUNTER — Ambulatory Visit: Payer: BC Managed Care – PPO | Admitting: Family

## 2020-01-31 LAB — URINALYSIS, ROUTINE W REFLEX MICROSCOPIC
Bilirubin Urine: NEGATIVE
Glucose, UA: NEGATIVE
Hgb urine dipstick: NEGATIVE
Leukocytes,Ua: NEGATIVE
Nitrite: NEGATIVE
Protein, ur: NEGATIVE
Specific Gravity, Urine: 1.006 (ref 1.001–1.03)
pH: 5.5 (ref 5.0–8.0)

## 2020-01-31 NOTE — Telephone Encounter (Signed)
Pt wants to cut prednisone in half due to face feeling like it is "on fire". Please advise

## 2020-01-31 NOTE — Telephone Encounter (Signed)
Sending your way since you saw her Thanks for seeing her

## 2020-01-31 NOTE — Telephone Encounter (Signed)
Mailbox is full and could not leave a message

## 2020-01-31 NOTE — Telephone Encounter (Signed)
Azzell  Ok to cut prednisone to 1/2 dose 10 mg    Claris Che  she needs also to address anxiety as well   TMS

## 2020-02-01 NOTE — Telephone Encounter (Signed)
Patient has been informed.

## 2020-02-08 ENCOUNTER — Encounter: Payer: Self-pay | Admitting: Family

## 2020-02-08 ENCOUNTER — Telehealth (INDEPENDENT_AMBULATORY_CARE_PROVIDER_SITE_OTHER): Payer: BC Managed Care – PPO | Admitting: Family

## 2020-02-08 VITALS — Ht 62.0 in | Wt 108.0 lb

## 2020-02-08 DIAGNOSIS — F419 Anxiety disorder, unspecified: Secondary | ICD-10-CM | POA: Diagnosis not present

## 2020-02-08 DIAGNOSIS — T50Z95D Adverse effect of other vaccines and biological substances, subsequent encounter: Secondary | ICD-10-CM | POA: Diagnosis not present

## 2020-02-08 DIAGNOSIS — T50Z95A Adverse effect of other vaccines and biological substances, initial encounter: Secondary | ICD-10-CM | POA: Insufficient documentation

## 2020-02-08 NOTE — Assessment & Plan Note (Addendum)
Unfortunately patient had predominant left sided  (after left arm pfizer vaccine 01/24/20) neurologic symptoms including burning, numbness. 15 days later symptoms had resolved after prednisone however yesterday and this morning symptoms re- appeared. Some degree noticeable on right side of body for now. Normal tsh, b12. We dicussed the role of cervical, thoracic MS etiology ( ? Radicular symptoms) and will consider Xray when she follows up.  Advised that she NOT get second covid vaccine at this time until we seek counsel of allergist. No referral placed today and we will this at follow up.

## 2020-02-08 NOTE — Assessment & Plan Note (Signed)
Chronic, exacerbated by allergic reaction to covid vaccine and stressful role as Production designer, theatre/television/film at school. We talked at length if somatic complaints r/t vaccine or anxiety and agreed much crossover here and I suspect anxiety playing a significant role.  Patient preferred to  Hold on starting prescription medication for anxiety and would like to start [facility] wart. I advised that I do not normally recommend this for anxiety and in particular due to its interaction with other medications; however as she is not on prescription medications at this time, if her preference is to trial [facility] wort, we can discuss at follow up how she is feeling. Referral to counseling.

## 2020-02-08 NOTE — Progress Notes (Signed)
Verbal consent for services obtained from patient prior to services given to TELEPHONE visit:   Location of call:  provider at work patient at home  Names of all persons present for services: Rennie Plowman, NP and patient Chief complaint:   Felt that all symptoms had resolved yesterday and then last night starting to feel 'prickling with heat' in upper back and left arm. She feels symptoms predominant on left side as she knows where the vaccine was administered however she will also notes that she has similar symptoms on right side. wandering if feeling anxiety. When she stops and takes a deep breaths.  Woke up prickling sensation on left arm and even more as talking about everything and feeling more anxious. Would like to start taking [facility] wort.   Left side of face and left arm started to become numb an hour after vaccine.   left tongue tingling 30 seconds after vaccine.   States that stress is playing a role and describes feeling panic attack while getting vaccine.  Completed 5mg  prednisone 3 days ago that dr prescribed. On the 10mg  prednisone felt she 'was on fire' and 'heart was racing.'   Pfizer 01/24/20- left arm.   NO fever, HA, vision changes.    History, background, results pertinent:  Chronic left sided shoulder pain near shoulder blade 'where my stress is'. had been seeing chiropractor and using T[facility] unit.   A/P/next steps: Problem List Items Addressed This Visit      Other   Adverse reaction to vaccine    Unfortunately patient had predominant left sided  (after left arm pfizer vaccine 01/24/20) neurologic symptoms including burning, numbness. 15 days later symptoms had resolved after prednisone however yesterday and this morning symptoms re- appeared. Some degree noticeable on right side of body for now. Normal tsh, b12. We dicussed the role of cervical, thoracic MS etiology ( ? Radicular symptoms) and will consider Xray when she follows up.  Advised that she  NOT get second covid vaccine at this time until we seek counsel of allergist. No referral placed today and we will this at follow up.       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 We talked at length if somatic complaints r/t vaccine or anxiety and agreed much crossover here and I suspect anxiety playing a significant role.  Patient preferred to  Hold on starting prescription medication for anxiety and would like to start [facility] wart. I advised that I do not normally recommend this for anxiety and in particular due to its interaction with other medications; however as she is not on prescription medications at this time, if her preference is to trial saint johns wort, we can discuss at follow up how she is feeling. Referral to counseling.       Relevant Orders   Ambulatory referral to Psychology       I have spent 25 minutes with a patient including precharting, exam, reviewing medical records, and discussion plan of care over the phone.

## 2020-02-23 ENCOUNTER — Ambulatory Visit (INDEPENDENT_AMBULATORY_CARE_PROVIDER_SITE_OTHER): Payer: BC Managed Care – PPO | Admitting: Psychology

## 2020-02-23 DIAGNOSIS — F411 Generalized anxiety disorder: Secondary | ICD-10-CM

## 2020-02-27 ENCOUNTER — Ambulatory Visit: Payer: BC Managed Care – PPO | Admitting: Psychology

## 2020-03-02 ENCOUNTER — Ambulatory Visit (INDEPENDENT_AMBULATORY_CARE_PROVIDER_SITE_OTHER): Payer: BC Managed Care – PPO | Admitting: Obstetrics and Gynecology

## 2020-03-02 ENCOUNTER — Other Ambulatory Visit: Payer: Self-pay

## 2020-03-02 ENCOUNTER — Ambulatory Visit (INDEPENDENT_AMBULATORY_CARE_PROVIDER_SITE_OTHER): Payer: BC Managed Care – PPO | Admitting: Family

## 2020-03-02 ENCOUNTER — Ambulatory Visit (INDEPENDENT_AMBULATORY_CARE_PROVIDER_SITE_OTHER): Payer: BC Managed Care – PPO

## 2020-03-02 ENCOUNTER — Encounter: Payer: Self-pay | Admitting: Family

## 2020-03-02 ENCOUNTER — Encounter: Payer: Self-pay | Admitting: Obstetrics and Gynecology

## 2020-03-02 VITALS — BP 90/60 | Ht 62.5 in | Wt 113.0 lb

## 2020-03-02 VITALS — BP 118/64 | HR 80 | Temp 98.0°F | Ht 63.5 in | Wt 112.8 lb

## 2020-03-02 DIAGNOSIS — M542 Cervicalgia: Secondary | ICD-10-CM | POA: Diagnosis not present

## 2020-03-02 DIAGNOSIS — Z1231 Encounter for screening mammogram for malignant neoplasm of breast: Secondary | ICD-10-CM | POA: Diagnosis not present

## 2020-03-02 DIAGNOSIS — Z01419 Encounter for gynecological examination (general) (routine) without abnormal findings: Secondary | ICD-10-CM | POA: Diagnosis not present

## 2020-03-02 DIAGNOSIS — Z0001 Encounter for general adult medical examination with abnormal findings: Secondary | ICD-10-CM | POA: Diagnosis not present

## 2020-03-02 DIAGNOSIS — G629 Polyneuropathy, unspecified: Secondary | ICD-10-CM | POA: Diagnosis not present

## 2020-03-02 DIAGNOSIS — Z1339 Encounter for screening examination for other mental health and behavioral disorders: Secondary | ICD-10-CM | POA: Diagnosis not present

## 2020-03-02 DIAGNOSIS — Z1331 Encounter for screening for depression: Secondary | ICD-10-CM | POA: Diagnosis not present

## 2020-03-02 DIAGNOSIS — Z0000 Encounter for general adult medical examination without abnormal findings: Secondary | ICD-10-CM

## 2020-03-02 NOTE — Patient Instructions (Signed)
Xrays  Let me know when I can order mammogram  Referral to neurology  Let us know if you dont hear back within a week in regards to an appointment being scheduled.   Stay safe   Health Maintenance, Female Adopting a healthy lifestyle and getting preventive care are important in promoting health and wellness. Ask your health care provider about:  The right schedule for you to have regular tests and exams.  Things you can do on your own to prevent diseases and keep yourself healthy. What should I know about diet, weight, and exercise? Eat a healthy diet   Eat a diet that includes plenty of vegetables, fruits, low-fat dairy products, and lean protein.  Do not eat a lot of foods that are high in solid fats, added sugars, or sodium. Maintain a healthy weight Body mass index (BMI) is used to identify weight problems. It estimates body fat based on height and weight. Your health care provider can help determine your BMI and help you achieve or maintain a healthy weight. Get regular exercise Get regular exercise. This is one of the most important things you can do for your health. Most adults should:  Exercise for at least 150 minutes each week. The exercise should increase your heart rate and make you sweat (moderate-intensity exercise).  Do strengthening exercises at least twice a week. This is in addition to the moderate-intensity exercise.  Spend less time sitting. Even light physical activity can be beneficial. Watch cholesterol and blood lipids Have your blood tested for lipids and cholesterol at 44 years of age, then have this test every 5 years. Have your cholesterol levels checked more often if:  Your lipid or cholesterol levels are high.  You are older than 44 years of age.  You are at high risk for heart disease. What should I know about cancer screening? Depending on your health history and family history, you may need to have cancer screening at various ages. This may  include screening for:  Breast cancer.  Cervical cancer.  Colorectal cancer.  Skin cancer.  Lung cancer. What should I know about heart disease, diabetes, and high blood pressure? Blood pressure and heart disease  High blood pressure causes heart disease and increases the risk of stroke. This is more likely to develop in people who have high blood pressure readings, are of African descent, or are overweight.  Have your blood pressure checked: ? Every 3-5 years if you are 23-74 years of age. ? Every year if you are 61 years old or older. Diabetes Have regular diabetes screenings. This checks your fasting blood sugar level. Have the screening done:  Once every three years after age 58 if you are at a normal weight and have a low risk for diabetes.  More often and at a younger age if you are overweight or have a high risk for diabetes. What should I know about preventing infection? Hepatitis B If you have a higher risk for hepatitis B, you should be screened for this virus. Talk with your health care provider to find out if you are at risk for hepatitis B infection. Hepatitis C Testing is recommended for:  Everyone born from [birth year] through 1965.  Anyone with known risk factors for hepatitis C. Sexually transmitted infections (STIs)  Get screened for STIs, including gonorrhea and chlamydia, if: ? You are sexually active and are younger than 44 years of age. ? You are older than 44 years of age and your health care provider  tells you that you are at risk for this type of infection. ? Your sexual activity has changed since you were last screened, and you are at increased risk for chlamydia or gonorrhea. Ask your health care provider if you are at risk.  Ask your health care provider about whether you are at high risk for HIV. Your health care provider may recommend a prescription medicine to help prevent HIV infection. If you choose to take medicine to prevent HIV, you should first  get tested for HIV. You should then be tested every 3 months for as long as you are taking the medicine. Pregnancy  If you are about to stop having your period (premenopausal) and you may become pregnant, seek counseling before you get pregnant.  Take 400 to 800 micrograms (mcg) of folic acid every day if you become pregnant.  Ask for birth control (contraception) if you want to prevent pregnancy. Osteoporosis and menopause Osteoporosis is a disease in which the bones lose minerals and strength with aging. This can result in bone fractures. If you are 54 years old or older, or if you are at risk for osteoporosis and fractures, ask your health care provider if you should:  Be screened for bone loss.  Take a calcium or vitamin D supplement to lower your risk of fractures.  Be given hormone replacement therapy (HRT) to treat symptoms of menopause. Follow these instructions at home: Lifestyle  Do not use any products that contain nicotine or tobacco, such as cigarettes, e-cigarettes, and chewing tobacco. If you need help quitting, ask your health care provider.  Do not use street drugs.  Do not share needles.  Ask your health care provider for help if you need support or information about quitting drugs. Alcohol use  Do not drink alcohol if: ? Your health care provider tells you not to drink. ? You are pregnant, may be pregnant, or are planning to become pregnant.  If you drink alcohol: ? Limit how much you use to 0-1 drink a day. ? Limit intake if you are breastfeeding.  Be aware of how much alcohol is in your drink. In the [place] one drink equals one 12 oz bottle of beer (355 mL), one 5 oz glass of wine (148 mL), or one 1 oz glass of hard liquor (44 mL). General instructions  Schedule regular health, dental, and eye exams.  Stay current with your vaccines.  Tell your health care provider if: ? You often feel depressed. ? You have ever been abused or do not feel safe at  home. Summary  Adopting a healthy lifestyle and getting preventive care are important in promoting health and wellness.  Follow your health care provider's instructions about healthy diet, exercising, and getting tested or screened for diseases.  Follow your health care provider's instructions on monitoring your cholesterol and blood pressure. This information is not intended to replace advice given to you by your health care provider. Make sure you discuss any questions you have with your health care provider. Document Revised: 05/26/2018 Document Reviewed: 05/26/2018 Elsevier Patient Education  2020 [employer].

## 2020-03-02 NOTE — Progress Notes (Signed)
Gynecology Annual Exam  PCP: Allegra Grana, FNP  Chief Complaint  Patient presents with  . Gynecologic Exam   History of Present Illness:  [name] is a 44 y.o. [phone] who LMP was Patient's last menstrual period was 02/01/2020 (approximate)., presents today for her annual examination.  Her menses are mostly regular. She has occasionally skipped a period. They last 7 day(s).  Dysmenorrhea none. She does not have intermenstrual bleeding.  She does not have vasomotor sx.   She is sexually active. Her husband has had a vasectomy.  Last Pap: 09/2017  Results were: no abnormalities /neg HPV DNA negative Hx of STDs: none  Last mammogram: 01/2019  Results were: normal--routine follow-up in 12 months There is no FH of breast cancer. There is no FH of ovarian cancer. The patient does do self-breast exams.  Colonoscopy: never had DEXA: has not been screened for osteoporosis  Tobacco use: The patient denies current or previous tobacco use. Alcohol use: social drinker Exercise: no regular, but is active  The patient wears seatbelts: yes.     She had an adverse reaction to the Pzifer COVID19 vaccine.  She had numbness and paresthesia to the injection.    Past Medical History:  Diagnosis Date  . Abnormal Pap smear of cervix   . Allergy    Hay fever/seasonal allergies   . Chicken pox   . Migraines   . Urticaria     Past Surgical History:  Procedure Laterality Date  . NO PAST SURGERIES      Prior to Admission medications   Medication Sig Start Date End Date Taking? Authorizing Provider  Ascorbic Acid (VITAMIN C) 1000 MG tablet Take 1,000 mg by mouth daily.    [provider]  CALCIUM-MAGNESIUM-VITAMIN D PO Take by mouth. Powder. One scoop every other week.    [provider]  Cholecalciferol (VITAMIN D3) 50 MCG (2000 UT) capsule Take 2,000 Units by mouth daily.    [provider]  Multiple Vitamins-Minerals (EMERGEN-C IMMUNE PLUS) PACK Take  by mouth.    [provider]  Omega-3 Fatty Acids (FISH OIL PO) Take 1 capsule by mouth daily.    [provider]  Probiotic Product (PROBIOTIC PO) Take 1 capsule by mouth daily.    [provider]  Quercetin 250 MG TABS Take 1 tablet by mouth in the morning and at bedtime.    [provider]  Zinc 100 MG TABS Take by mouth.    [provider]    Allergies  Allergen Reactions  . Doxycycline Other (See Comments)    Brain fog, not feeling well - most likely non IgE mediated side effect of medication.  Barbera Setters [Levofloxacin In D5w] Other (See Comments)    Brain fog, not feeling well - most likely non IgE mediated side effect of medication.  . Multivitamins     maryruth MV; hives 30 minutes after   Obstetric History: [id number]  Family History  Problem Relation Age of Onset  . Uterine cancer Maternal Grandmother   . COPD Maternal Grandmother   . Other Maternal Grandmother        Huntington's disease  . Huntington's disease Mother   . Asthma Mother   . Other Mother        Huntington's disease  . Mental illness Sister        mulitple personality disorder  . Heart disease Maternal Grandfather   . Hyperlipidemia Maternal Grandfather   . Asthma Paternal Grandmother   .  Drug abuse Paternal Grandmother   . Heart disease Paternal Grandmother   . Hyperlipidemia Paternal Grandmother   . Kidney disease Paternal Grandmother   . Alzheimer's disease Paternal Grandfather     Social History   Socioeconomic History  . Marital status: Married    Spouse name: Not on file  . Number of children: Not on file  . Years of education: Not on file  . Highest education level: Not on file  Occupational History  . Not on file  Tobacco Use  . Smoking status: Never Smoker  . Smokeless tobacco: Never Used  Vaping Use  . Vaping Use: Never used  Substance and Sexual Activity  . Alcohol use: Yes  . Drug use: Not Currently  . Sexual activity: Yes     Birth control/protection: Surgical    Comment: Vasectomy  Other Topics Concern  . Not on file  Social History Narrative   Married, mother.    Asst. Principal at school    Social Determinants of Health   Financial Resource Strain:   . Difficulty of Paying Living Expenses: Not on file  Food Insecurity:   . Worried About Programme researcher, broadcasting/film/video in the Last Year: Not on file  . Ran Out of Food in the Last Year: Not on file  Transportation Needs:   . Lack of Transportation (Medical): Not on file  . Lack of Transportation (Non-Medical): Not on file  Physical Activity:   . Days of Exercise per Week: Not on file  . Minutes of Exercise per Session: Not on file  Stress:   . Feeling of Stress : Not on file  Social Connections:   . Frequency of Communication with Friends and Family: Not on file  . Frequency of Social Gatherings with Friends and Family: Not on file  . Attends Religious Services: Not on file  . Active Member of Clubs or Organizations: Not on file  . Attends Banker Meetings: Not on file  . Marital Status: Not on file  Intimate Partner Violence:   . Fear of Current or Ex-Partner: Not on file  . Emotionally Abused: Not on file  . Physically Abused: Not on file  . Sexually Abused: Not on file    Review of Systems  Constitutional: Negative.   HENT: Negative.   Eyes: Negative.   Respiratory: Negative.   Cardiovascular: Negative.   Gastrointestinal: Negative.   Genitourinary: Negative.   Musculoskeletal: Negative.   Skin: Negative.   Neurological: Negative.   Psychiatric/Behavioral: Negative.      Physical Exam BP 90/60   Ht 5' 2.5" (1.588 m)   Wt 113 lb (51.3 kg)   LMP 02/01/2020 (Approximate)   BMI 20.34 kg/m   Physical Exam Constitutional:      General: She is not in acute distress.    Appearance: Normal appearance. She is well-developed.  Genitourinary:     Pelvic exam was performed with patient in the lithotomy position.     Vulva, urethra,  bladder and uterus normal.     No inguinal adenopathy present in the right or left side.    No signs of injury in the vagina.     No vaginal discharge, erythema, tenderness or bleeding.     No cervical motion tenderness, discharge, lesion or polyp.     Uterus is mobile.     Uterus is not enlarged or tender.     No uterine mass detected.    Uterus is anteverted.     No right  or left adnexal mass present.     Right adnexa not tender or full.     Left adnexa not tender or full.  HENT:     Head: Normocephalic and atraumatic.  Eyes:     General: No scleral icterus.    Conjunctiva/sclera: Conjunctivae normal.  Neck:     Thyroid: No thyromegaly.  Cardiovascular:     Rate and Rhythm: Normal rate and regular rhythm.     Heart sounds: No murmur heard.  No friction rub. No gallop.   Pulmonary:     Effort: Pulmonary effort is normal. No respiratory distress.     Breath sounds: Normal breath sounds. No wheezing or rales.  Chest:     Breasts:        Right: No inverted nipple, mass, nipple discharge, skin change or tenderness.        Left: No inverted nipple, mass, nipple discharge, skin change or tenderness.  Abdominal:     General: Bowel sounds are normal. There is no distension.     Palpations: Abdomen is soft. There is no mass.     Tenderness: There is no abdominal tenderness. There is no guarding or rebound.  Musculoskeletal:        General: No swelling or tenderness. Normal range of motion.     Cervical back: Normal range of motion and neck supple.  Lymphadenopathy:     Cervical: No cervical adenopathy.     Lower Body: No right inguinal adenopathy. No left inguinal adenopathy.  Neurological:     General: No focal deficit present.     Mental Status: She is alert and oriented to person, place, and time.     Cranial Nerves: No cranial nerve deficit.  Skin:    General: Skin is warm and dry.     Findings: No erythema or rash.  Psychiatric:        Mood and Affect: Mood normal.         Behavior: Behavior normal.        Judgment: Judgment normal.    Female chaperone present for pelvic and breast  portions of the physical exam  Results: AUDIT Questionnaire (screen for alcoholism): 0 PHQ-9: 3  Assessment: 44 y.o. [id number] female here for routine gynecologic examination.  Plan: Problem List Items Addressed This Visit    None    Visit Diagnoses    Women's annual routine gynecological examination    -  Primary   Screening for depression       Screening for alcoholism       Encounter for screening mammogram for malignant neoplasm of breast          Screening: -- Blood pressure screen normal -- Colonoscopy - not due -- Mammogram - due. Patient to call [facility] to arrange. She understands that it is her responsibility to arrange this. -- Weight screening: normal -- Depression screening negative (PHQ-9) -- Nutrition: normal -- cholesterol screening: per PCP -- osteoporosis screening: not due -- tobacco screening: not using -- alcohol screening: AUDIT questionnaire indicates low-risk usage. -- family history of breast cancer screening: done. not at high risk. -- no evidence of domestic violence or intimate partner violence. -- STD screening: gonorrhea/chlamydia NAAT not collected per patient request. -- pap smear not collected per [facility] guidelines  Thomasene Mohair, MD 03/02/2020 9:03 AM

## 2020-03-02 NOTE — Progress Notes (Signed)
Subjective:    Patient ID: Nichole Crawford, female    DOB: 21-Nov-1975, 44 y.o.   MRN: [id number]  CC: FREDRICK DRAY is a 44 y.o. female who presents today for physical exam.    HPI: Anxiety- started [place] wart and 'feels calmer'. No depression. Has been doing counseling.  Sleeping well.  No Si/hi.   Continues to have left sided arm and left side of cheek and bilateral thighs,  'prickly warm feeling' since covid vaccine in left arm 6 weeks ago, overall improving. Prickly sensation in left hand and extends toward elbow.  She will have symptom in right arm as well, less often.  Numbness has resolved.  Feels that is 'lingering' from vaccine and her system being overwhelmed by vaccine, anxiety. Describes that some days in which she doesn't have symptom. Less noticeable when busy. Notices that when gets upset with children, she will feel sensation of prickly and questions whether anxiety.   Has been seeing chiropractor with tens unit, acpuncture for lower back, neck pain , primarily left sided, which has been going on prior to getting covid vaccine. Questions whether exacerbated by vaccine.  No saddle anesthesia, trouble urinating, having bowel movements. No falls  Having massages every 2-3 weeks which helps neck pain.   No HA, fever, vision changes.      Colorectal Cancer Screening: Due next year. Breast Cancer Screening: Mammogram due Cervical Cancer Screening: UTD; follows with Dr Jean Rosenthal who she saw this morning and had exam, breast exam.  Bone Health screening/DEXA for 65+: No increased fracture risk. Defer screening at this time. Lung Cancer Screening: Doesn't have 30 year pack year history and age > 55 years.       Tetanus - utd    Hepatitis C screening - Candidate for, declines HIV Screening- Candidate for , declines Labs: Screening labs done prior.  Exercise: Gets regular exercise.  Alcohol use: occassional Smoking/tobacco use: Nonsmoker.    HISTORY:  Past Medical  History:  Diagnosis Date  . Abnormal Pap smear of cervix   . Allergy    Hay fever/seasonal allergies   . Chicken pox   . Migraines   . Urticaria     Past Surgical History:  Procedure Laterality Date  . NO PAST SURGERIES     Family History  Problem Relation Age of Onset  . Uterine cancer Maternal Grandmother   . COPD Maternal Grandmother   . Other Maternal Grandmother        Huntington's disease  . Huntington's disease Mother   . Asthma Mother   . Other Mother        Huntington's disease  . Mental illness Sister        mulitple personality disorder  . Heart disease Maternal Grandfather   . Hyperlipidemia Maternal Grandfather   . Asthma Paternal Grandmother   . Drug abuse Paternal Grandmother   . Heart disease Paternal Grandmother   . Hyperlipidemia Paternal Grandmother   . Kidney disease Paternal Grandmother   . Alzheimer's disease Paternal Grandfather       ALLERGIES: Doxycycline, Levaquin [levofloxacin in d5w], and Multivitamins  Current Outpatient Medications on File Prior to Visit  Medication Sig Dispense Refill  . Acetylcysteine (NAC) 600 MG CAPS Take 1 capsule by mouth daily.    . Ascorbic Acid (VITAMIN C) 1000 MG tablet Take 1,000 mg by mouth daily.    Marland Kitchen b complex vitamins capsule Take 1 capsule by mouth daily.    . Cholecalciferol (VITAMIN D3) 50  MCG (2000 UT) capsule Take 2,000 Units by mouth daily.    . Omega-3 Fatty Acids (FISH OIL PO) Take 1 capsule by mouth daily.    . Probiotic Product (PROBIOTIC PO) Take 1 capsule by mouth daily.    . Quercetin 250 MG TABS Take 1 tablet by mouth in the morning and at bedtime.    . Zinc 30 MG CAPS Take 1 capsule by mouth daily.     No current facility-administered medications on file prior to visit.    Social History   Tobacco Use  . Smoking status: Never Smoker  . Smokeless tobacco: Never Used  Vaping Use  . Vaping Use: Never used  Substance Use Topics  . Alcohol use: Yes  . Drug use: Not Currently     Review of Systems  Constitutional: Negative for chills, fever and unexpected weight change.  HENT: Negative for congestion.   Eyes: Negative for visual disturbance.  Respiratory: Negative for cough.   Cardiovascular: Negative for chest pain, palpitations and leg swelling.  Gastrointestinal: Negative for nausea and vomiting.  Musculoskeletal: Positive for neck pain. Negative for arthralgias and myalgias.  Skin: Negative for rash.  Neurological: Positive for numbness. Negative for weakness and headaches.  Hematological: Negative for adenopathy.  Psychiatric/Behavioral: Negative for confusion, sleep disturbance and suicidal ideas. The patient is nervous/anxious.       Objective:    BP 118/64   Pulse 80   Temp 98 F (36.7 C)   Ht 5' 3.5" (1.613 m)   Wt 112 lb 12.8 oz (51.2 kg)   LMP 02/01/2020 (Approximate)   SpO2 99%   BMI 19.67 kg/m   BP Readings from Last 3 Encounters:  03/02/20 118/64  03/02/20 90/60  01/25/20 122/84   Wt Readings from Last 3 Encounters:  03/02/20 112 lb 12.8 oz (51.2 kg)  03/02/20 113 lb (51.3 kg)  02/08/20 108 lb (49 kg)    Physical Exam Vitals reviewed.  Constitutional:      Appearance: She is well-developed.  Eyes:     Conjunctiva/sclera: Conjunctivae normal.  Cardiovascular:     Rate and Rhythm: Normal rate and regular rhythm.     Pulses: Normal pulses.     Heart sounds: Normal heart sounds.  Pulmonary:     Effort: Pulmonary effort is normal.     Breath sounds: Normal breath sounds. No wheezing, rhonchi or rales.  Musculoskeletal:     Left shoulder: Normal. No swelling. Normal range of motion.     Right upper arm: Normal. No tenderness.     Left upper arm: Normal. No tenderness.     Cervical back: Full passive range of motion without pain and normal range of motion. Normal range of motion.     Comments: Grip strength normal BUE. Sensation intact BUE.   Skin:    General: Skin is warm and dry.  Neurological:     Mental Status: She  is alert.  Psychiatric:        Speech: Speech normal.        Behavior: Behavior normal.        Thought Content: Thought content normal.        Assessment & Plan:   Problem List Items Addressed This Visit      Nervous and Auditory   Neuropathy    Complex as unsure if prior chronic neck pain, anxiety and more recently mrna covid vaccine contributing. Certainly symptoms exacerbated after vaccine which may relate vaccine and surrounding anxiety. Xr of entire spine largely  unrevealing for significant spinal stenosis causing radicular symptoms though MRI spine may be appropriate and we can discuss at follow up. Advised consult with neurology and patient very agreeable. Will follow.         Other   Neck pain - Primary    Chronic unchanged. XR cervical spine negative for degenerative disc disease. Will follow      Relevant Orders   DG Cervical Spine Complete (Completed)   DG Thoracic Spine W/Swimmers (Completed)   DG Lumbar Spine Complete (Completed)   Ambulatory referral to Neurology   Routine physical examination    Declines CBE , pelvic exam as done prior with Dr Jean Rosenthal. Encouraged continued exercise.           I have discontinued Hilary Hertz. Picotte's Multiple Vitamins-Minerals (ZINC PO). I am also having her maintain her Probiotic Product (PROBIOTIC PO), vitamin C, Vitamin D3, Omega-3 Fatty Acids (FISH OIL PO), Quercetin, Calcium-Magnesium-Vitamin D (CALCIUM MAGNESIUM PO), Zinc, NAC, and b complex vitamins.   No orders of the defined types were placed in this encounter.   Return precautions given.   Risks, benefits, and alternatives of the medications and treatment plan prescribed today were discussed, and patient expressed understanding.   Education regarding symptom management and diagnosis given to patient on AVS.   Continue to follow with Allegra Grana, FNP for routine health maintenance.   Hilary Hertz Mastrianni and I agreed with plan.   Rennie Plowman, FNP  I have  spent 21 minutes with a patient including discussion of anxiety, covid vaccine adverse side effects, MS exam, reviewing xrays, and discussion plan of care.

## 2020-03-05 ENCOUNTER — Ambulatory Visit (INDEPENDENT_AMBULATORY_CARE_PROVIDER_SITE_OTHER): Payer: BC Managed Care – PPO | Admitting: Psychology

## 2020-03-05 DIAGNOSIS — F411 Generalized anxiety disorder: Secondary | ICD-10-CM

## 2020-03-06 DIAGNOSIS — G629 Polyneuropathy, unspecified: Secondary | ICD-10-CM | POA: Insufficient documentation

## 2020-03-06 NOTE — Assessment & Plan Note (Signed)
Chronic unchanged. XR cervical spine negative for degenerative disc disease. Will follow

## 2020-03-06 NOTE — Assessment & Plan Note (Addendum)
Complex as unsure if prior chronic neck pain, anxiety and more recently mrna covid vaccine contributing. Certainly symptoms exacerbated after vaccine which may relate vaccine and surrounding anxiety. Xr of entire spine largely unrevealing for significant spinal stenosis causing radicular symptoms though MRI spine may be appropriate and we can discuss at follow up. Advised consult with neurology and patient very agreeable. Will follow.

## 2020-03-06 NOTE — Assessment & Plan Note (Signed)
Declines CBE , pelvic exam as done prior with Dr Jean Rosenthal. Encouraged continued exercise.

## 2020-03-07 ENCOUNTER — Telehealth: Payer: Self-pay | Admitting: Family

## 2020-03-07 NOTE — Telephone Encounter (Signed)
We do not treat chronic pain. Thank you for the referral.  From [facility]

## 2020-03-12 ENCOUNTER — Ambulatory Visit (INDEPENDENT_AMBULATORY_CARE_PROVIDER_SITE_OTHER): Payer: BC Managed Care – PPO | Admitting: Psychology

## 2020-03-12 DIAGNOSIS — F411 Generalized anxiety disorder: Secondary | ICD-10-CM | POA: Diagnosis not present

## 2020-03-13 NOTE — Telephone Encounter (Signed)
rasheedah Would you call neurology? Perhaps I wasn't clear in regards to referral. She does have chronic neck pain which is not why im sending her there. I can manage chronic pain,  It is neuropathies she has had actually which coinside with covid mrna vaccine. I question whether she  May benefit from EMG study?  Would they reconsider?  Do they need to look at my last OV note?

## 2020-03-13 NOTE — Telephone Encounter (Signed)
I called [facility] and I expressed that pt is having chronic neck pain. Sarah at [facility] neurology stated they do not treat any type of chronic pain and that pt would need to have a referral to pain management.

## 2020-03-14 NOTE — Telephone Encounter (Signed)
Sorry , I dont think im explaining this right  Yes, patient has chronic neck pain however that IS NOT why I have placed referral.   I want neurology to see pt due to neuropathy and paresthesias of upper extremities; please call them again  Please call them again and state this is NOT for chronic pain, neck pain,

## 2020-03-15 NOTE — Telephone Encounter (Signed)
Ok Then that needs to go on the referral has the Dx. Thanks

## 2020-03-16 ENCOUNTER — Other Ambulatory Visit: Payer: Self-pay | Admitting: Family

## 2020-03-16 DIAGNOSIS — G629 Polyneuropathy, unspecified: Secondary | ICD-10-CM

## 2020-03-16 NOTE — Telephone Encounter (Signed)
New ref placed

## 2020-03-16 NOTE — Telephone Encounter (Signed)
Ok. Received and sent. :)  Thank you!

## 2020-03-23 ENCOUNTER — Ambulatory Visit (INDEPENDENT_AMBULATORY_CARE_PROVIDER_SITE_OTHER): Payer: BC Managed Care – PPO | Admitting: Psychology

## 2020-03-23 DIAGNOSIS — F411 Generalized anxiety disorder: Secondary | ICD-10-CM

## 2020-03-28 ENCOUNTER — Encounter: Payer: Self-pay | Admitting: Neurology

## 2020-04-02 ENCOUNTER — Ambulatory Visit (INDEPENDENT_AMBULATORY_CARE_PROVIDER_SITE_OTHER): Payer: BC Managed Care – PPO | Admitting: Psychology

## 2020-04-02 DIAGNOSIS — F411 Generalized anxiety disorder: Secondary | ICD-10-CM

## 2020-04-09 ENCOUNTER — Ambulatory Visit: Payer: BC Managed Care – PPO | Admitting: Psychology

## 2020-04-16 ENCOUNTER — Ambulatory Visit: Payer: BC Managed Care – PPO | Admitting: Psychology

## 2020-05-28 ENCOUNTER — Ambulatory Visit (INDEPENDENT_AMBULATORY_CARE_PROVIDER_SITE_OTHER): Payer: BC Managed Care – PPO | Admitting: Psychology

## 2020-05-28 DIAGNOSIS — F411 Generalized anxiety disorder: Secondary | ICD-10-CM | POA: Diagnosis not present

## 2020-06-29 ENCOUNTER — Ambulatory Visit: Payer: BC Managed Care – PPO | Admitting: Neurology

## 2020-07-09 ENCOUNTER — Ambulatory Visit (INDEPENDENT_AMBULATORY_CARE_PROVIDER_SITE_OTHER): Payer: Self-pay | Admitting: Psychology

## 2020-07-09 DIAGNOSIS — F411 Generalized anxiety disorder: Secondary | ICD-10-CM

## 2020-09-21 ENCOUNTER — Ambulatory Visit: Payer: BC Managed Care – PPO | Admitting: Neurology

## 2020-09-21 ENCOUNTER — Other Ambulatory Visit: Payer: Self-pay

## 2020-09-21 ENCOUNTER — Encounter: Payer: Self-pay | Admitting: Neurology

## 2020-09-21 VITALS — BP 120/73 | HR 79 | Ht 62.0 in | Wt 118.0 lb

## 2020-09-21 DIAGNOSIS — R202 Paresthesia of skin: Secondary | ICD-10-CM

## 2020-09-21 NOTE — Progress Notes (Signed)
[facility] Clinic Note - Initial Visit   Date: 09/21/20  Nichole Crawford MRN: [id number] DOB: February 02, 1976   Dear Rennie Plowman, FNP:  Thank you for your kind referral of Nichole Crawford for consultation of generalized tingling. Although her history is well known to you, please allow [place] to reiterate it for the purpose of our medical record. The patient was accompanied to the clinic by self.    History of Present Illness: Nichole Crawford is a 45 y.o. right-handed female presenting for evaluation of generalized tingling.  She had the COVID vaccine in August 2021 and when she returned to work, she developed left side facial numbness, tongue numbness, and tingling into the left hand.  She called her PCP and urged them to give her something for allergic reaction.  She was started on benadryl and prednisone.  Around the same time, she was having a lot of neck pain and was seeing a chriopractor. Symptoms subsided within 2 months.  Since then, she has noticed that stress and dehydration tends to cause tingling sensation over the arms, legs, and head with stress.  It improves with taking a few deep breathes.  She takes [facility] Wart and Ashwaghanda which helps.  She works as an Production designer, theatre/television/film in Programme researcher, broadcasting/film/video.  She discloses that since the summer of 2021, she has been under a lot of stress related to family and work.  Work is stressful.  Her husband travels for work and she has three children.  Her mother has Huntington's disease and recently moved into a facility and patient helps to make most of the decisions regarding her care.  Patient has not wanted to get tested, unless she has any similar symptoms.   Out-side paper records, electronic medical record, and images have been reviewed where available and summarized as:  No results found for: HGBA1C Lab Results  Component Value Date   VITAMINB12 770 01/30/2020   Lab Results  Component Value Date   TSH 1.45 01/30/2020     Past Medical History:  Diagnosis Date  . Abnormal Pap smear of cervix   . Allergy    Hay fever/seasonal allergies   . Chicken pox   . Migraines   . Urticaria     Past Surgical History:  Procedure Laterality Date  . NO PAST SURGERIES       Medications:  Outpatient Encounter Medications as of 09/21/2020  Medication Sig  . Acetylcysteine (NAC) 600 MG CAPS Take 1 capsule by mouth daily.  . Ascorbic Acid (VITAMIN C) 1000 MG tablet Take 1,000 mg by mouth daily.  Marland Kitchen b complex vitamins capsule Take 1 capsule by mouth daily.  . Calcium-Magnesium-Vitamin D (CALCIUM MAGNESIUM PO) Take 1 tablet by mouth daily. Take one tablet by mouth daily.  . Cholecalciferol (VITAMIN D3) 50 MCG (2000 UT) capsule Take 2,000 Units by mouth daily.  . Omega-3 Fatty Acids (FISH OIL PO) Take 1 capsule by mouth daily.  . Probiotic Product (PROBIOTIC PO) Take 1 capsule by mouth daily.  . Quercetin 250 MG TABS Take 1 tablet by mouth in the morning and at bedtime.  . Zinc 30 MG CAPS Take 1 capsule by mouth daily.   No facility-administered encounter medications on file as of 09/21/2020.    Allergies:  Allergies  Allergen Reactions  . Doxycycline Other (See Comments)    Brain fog, not feeling well - most likely non IgE mediated side effect of medication.  Barbera Setters [Levofloxacin In D5w] Other (See Comments)  Brain fog, not feeling well - most likely non IgE mediated side effect of medication.  . Multivitamins     maryruth MV; hives 30 minutes after    Family History: Family History  Problem Relation Age of Onset  . Uterine cancer Maternal Grandmother   . COPD Maternal Grandmother   . Other Maternal Grandmother        Huntington's disease  . Huntington's disease Mother   . Asthma Mother   . Other Mother        Huntington's disease  . Mental illness Sister        mulitple personality disorder  . Heart disease Maternal Grandfather   . Hyperlipidemia Maternal Grandfather   . Asthma Paternal  Grandmother   . Drug abuse Paternal Grandmother   . Heart disease Paternal Grandmother   . Hyperlipidemia Paternal Grandmother   . Kidney disease Paternal Grandmother   . Alzheimer's disease Paternal Grandfather     Social History: Social History   Tobacco Use  . Smoking status: Never Smoker  . Smokeless tobacco: Never Used  Vaping Use  . Vaping Use: Never used  Substance Use Topics  . Alcohol use: Yes    Comment: occ. once a week  . Drug use: Not Currently   Social History   Social History Narrative   Married, mother.    3 kids   Asst. Principal at school    Mother has huntington's and lives in [place]   She has two younger sister   Right handed    Vital Signs:  BP 120/73 (BP Location: Left Arm, Patient Position: Sitting, Cuff Size: Small)   Pulse 79   Ht 5\' 2"  (1.575 m)   Wt 118 lb (53.5 kg)   SpO2 100%   BMI 21.58 kg/m   Neurological Exam: MENTAL STATUS including orientation to time, place, person, recent and remote memory, attention span and concentration, language, and fund of knowledge is normal.  Speech is not dysarthric.  CRANIAL NERVES: II:  No visual field defects. III-IV-VI: Pupils equal round and reactive to light.  Normal conjugate, extra-ocular eye movements in all directions of gaze.  No nystagmus.  No ptosis.   V:  Normal facial sensation.    VII:  Normal facial symmetry and movements.   VIII:  Normal hearing and vestibular function.   IX-X:  Normal palatal movement.   XI:  Normal shoulder shrug and head rotation.   XII:  Normal tongue strength and range of motion, no deviation or fasciculation.  MOTOR:  No atrophy, fasciculations or abnormal movements.  No pronator drift.   Upper Extremity:  Right  Left  Deltoid  5/5   5/5   Biceps  5/5   5/5   Triceps  5/5   5/5   Infraspinatus 5/5  5/5  Medial pectoralis 5/5  5/5  Wrist extensors  5/5   5/5   Wrist flexors  5/5   5/5   Finger extensors  5/5   5/5   Finger flexors  5/5   5/5    Dorsal interossei  5/5   5/5   Abductor pollicis  5/5   5/5   Tone (Ashworth scale)  0  0   Lower Extremity:  Right  Left  Hip flexors  5/5   5/5   Hip extensors  5/5   5/5   Adductor 5/5  5/5  Abductor 5/5  5/5  Knee flexors  5/5   5/5   Knee extensors  5/5  5/5   Dorsiflexors  5/5   5/5   Plantarflexors  5/5   5/5   Toe extensors  5/5   5/5   Toe flexors  5/5   5/5   Tone (Ashworth scale)  0  0   MSRs:  Right        Left                  brachioradialis 2+  2+  biceps 2+  2+  triceps 2+  2+  patellar 2+  2+  ankle jerk 2+  2+  Hoffman no  no  plantar response down  down   SENSORY:  Normal and symmetric perception of light touch, pinprick, vibration, and proprioception.  Romberg's sign absent.   COORDINATION/GAIT: Normal finger-to- nose-finger and heel-to-shin.  Intact rapid alternating movements bilaterally.  Able to rise from a chair without using arms.  Gait narrow based and stable. Tandem and stressed gait intact.    IMPRESSION: Migratory paresthesias, stress-induced.  She has good insight into stress awareness and triggers and agrees that symptoms are related to this.   - Reassured patient that she does not have any signs of MS or neuropathy  - Stress management techniques recommended  - She has seen a counselor and feels that she already know the coping mechanisms, but will to make time for personal care   Thank you for allowing me to participate in patient's care.  If I can answer any additional questions, I would be pleased to do so.    Sincerely,    Alice Vitelli K. Allena Katz, DO

## 2020-09-21 NOTE — Patient Instructions (Signed)
It was great to see you today.  Take time for yourself!

## 2021-01-03 ENCOUNTER — Other Ambulatory Visit: Payer: Self-pay | Admitting: Obstetrics and Gynecology

## 2021-01-03 DIAGNOSIS — Z1231 Encounter for screening mammogram for malignant neoplasm of breast: Secondary | ICD-10-CM

## 2021-01-07 ENCOUNTER — Ambulatory Visit
Admission: RE | Admit: 2021-01-07 | Discharge: 2021-01-07 | Disposition: A | Discharge status: Home / Self Care | Payer: BC Managed Care – PPO | Source: Ambulatory Visit | Attending: Obstetrics and Gynecology | Admitting: Obstetrics and Gynecology

## 2021-01-07 ENCOUNTER — Other Ambulatory Visit: Payer: Self-pay

## 2021-01-07 ENCOUNTER — Ambulatory Visit: Payer: BC Managed Care – PPO

## 2021-01-07 DIAGNOSIS — Z1231 Encounter for screening mammogram for malignant neoplasm of breast: Secondary | ICD-10-CM

## 2021-01-08 ENCOUNTER — Other Ambulatory Visit: Payer: Self-pay | Admitting: Obstetrics & Gynecology

## 2021-01-08 DIAGNOSIS — R928 Other abnormal and inconclusive findings on diagnostic imaging of breast: Secondary | ICD-10-CM

## 2021-01-08 NOTE — Progress Notes (Signed)
Orders placed.  Call and let patient know :  The results of your recent mammogram reveals an area that needs further magnification views to determine if there is any concern or if it is just a false alarm. There is no suggestion of cancer, just a need for further images and evaluation by the radiologist. They should be contacting you, if not already, to schedule these additional mammogram pictures. If you have any questions, or if they have not yet contacted you, then please give [place] a call at [phone], so that we may help in this process. I know this seems worrisome, yet usually additional xrays clear up any suspicion for breast cancer.

## 2021-01-08 NOTE — Progress Notes (Signed)
Pt aware of results and orders in; may call and schedule.

## 2021-01-15 ENCOUNTER — Other Ambulatory Visit: Payer: Self-pay

## 2021-01-15 ENCOUNTER — Ambulatory Visit
Admission: RE | Admit: 2021-01-15 | Discharge: 2021-01-15 | Disposition: A | Discharge status: Home / Self Care | Payer: BC Managed Care – PPO | Source: Ambulatory Visit | Attending: Obstetrics & Gynecology | Admitting: Obstetrics & Gynecology

## 2021-01-15 ENCOUNTER — Ambulatory Visit: Payer: BC Managed Care – PPO

## 2021-01-15 DIAGNOSIS — R928 Other abnormal and inconclusive findings on diagnostic imaging of breast: Secondary | ICD-10-CM

## 2021-01-17 ENCOUNTER — Other Ambulatory Visit: Payer: BC Managed Care – PPO

## 2021-01-23 ENCOUNTER — Other Ambulatory Visit: Payer: BC Managed Care – PPO

## 2021-03-04 ENCOUNTER — Ambulatory Visit: Payer: BC Managed Care – PPO | Admitting: Obstetrics and Gynecology

## 2021-03-07 ENCOUNTER — Other Ambulatory Visit: Payer: Self-pay

## 2021-03-07 ENCOUNTER — Encounter: Payer: Self-pay | Admitting: Obstetrics and Gynecology

## 2021-03-07 ENCOUNTER — Ambulatory Visit (INDEPENDENT_AMBULATORY_CARE_PROVIDER_SITE_OTHER): Payer: BC Managed Care – PPO | Admitting: Obstetrics and Gynecology

## 2021-03-07 VITALS — BP 110/70 | HR 87 | Ht 62.5 in | Wt 119.0 lb

## 2021-03-07 DIAGNOSIS — Z01419 Encounter for gynecological examination (general) (routine) without abnormal findings: Secondary | ICD-10-CM | POA: Diagnosis not present

## 2021-03-07 DIAGNOSIS — Z1339 Encounter for screening examination for other mental health and behavioral disorders: Secondary | ICD-10-CM | POA: Diagnosis not present

## 2021-03-07 DIAGNOSIS — Z1331 Encounter for screening for depression: Secondary | ICD-10-CM | POA: Diagnosis not present

## 2021-03-07 NOTE — Progress Notes (Signed)
Gynecology Annual Exam  PCP: Allegra Grana, FNP  Chief Complaint  Patient presents with   Annual Exam   History of Present Illness:  [name] is a 45 y.o. [phone] who LMP was Patient's last menstrual period was 02/09/2021 (approximate)., presents today for her annual examination.  Her menses are mostly regular. She has occasionally skipped a period. They last 7 day(s).  Dysmenorrhea none. She does not have intermenstrual bleeding.  She does not have vasomotor sx.   She is sexually active. Her husband has had a vasectomy.  Last Pap: 09/2017  Results were: no abnormalities /neg HPV DNA negative Hx of STDs: none  Last mammogram: 01/2021  Results were: normal--routine follow-up in 12 months There is no FH of breast cancer. There is no FH of ovarian cancer. The patient does do self-breast exams.  Colonoscopy: never had DEXA: has not been screened for osteoporosis  Tobacco use: The patient denies current or previous tobacco use. Alcohol use: social drinker Exercise: no regular, but is active  The patient wears seatbelts: yes.     She had an adverse reaction to the Pzifer COVID19 vaccine.  She had numbness and paresthesia to the injection.  She has seen neurology and has a stress-induced migratory paresthesia.   Past Medical History:  Diagnosis Date   Abnormal Pap smear of cervix    Allergy    Hay fever/seasonal allergies    Chicken pox    Migraines    Urticaria     Past Surgical History:  Procedure Laterality Date   NO PAST SURGERIES      Prior to Admission medications   Medication Sig Start Date End Date Taking? Authorizing Provider  Ascorbic Acid (VITAMIN C) 1000 MG tablet Take 1,000 mg by mouth daily.    [provider]  CALCIUM-MAGNESIUM-VITAMIN D PO Take by mouth. Powder. One scoop every other week.    [provider]  Cholecalciferol (VITAMIN D3) 50 MCG (2000 UT) capsule Take 2,000 Units by mouth daily.    [provider]   Multiple Vitamins-Minerals (EMERGEN-C IMMUNE PLUS) PACK Take by mouth.    [provider]  Omega-3 Fatty Acids (FISH OIL PO) Take 1 capsule by mouth daily.    [provider]  Probiotic Product (PROBIOTIC PO) Take 1 capsule by mouth daily.    [provider]  Quercetin 250 MG TABS Take 1 tablet by mouth in the morning and at bedtime.    [provider]  Zinc 100 MG TABS Take by mouth.    [provider]    Allergies  Allergen Reactions   Doxycycline Other (See Comments)    Brain fog, not feeling well - most likely non IgE mediated side effect of medication.   Levaquin [Levofloxacin In D5w] Other (See Comments)    Brain fog, not feeling well - most likely non IgE mediated side effect of medication.   Multivitamins     maryruth MV; hives 30 minutes after   Obstetric History: [id number]  Family History  Problem Relation Age of Onset   Huntington's disease Mother    Asthma Mother    Other Mother        Huntington's disease   Mental illness Sister        mulitple personality disorder   Endometrial cancer Maternal Aunt 53       No genetic testing   Uterine cancer Maternal Grandmother    COPD Maternal Grandmother    Other Maternal Grandmother  Huntington's disease   Heart disease Maternal Grandfather    Hyperlipidemia Maternal Grandfather    Asthma Paternal Grandmother    Drug abuse Paternal Grandmother    Heart disease Paternal Grandmother    Hyperlipidemia Paternal Grandmother    Kidney disease Paternal Grandmother    Alzheimer's disease Paternal Grandfather     Social History   Socioeconomic History   Marital status: Married    Spouse name: Not on file   Number of children: Not on file   Years of education: Not on file   Highest education level: Not on file  Occupational History   Not on file  Tobacco Use   Smoking status: Never   Smokeless tobacco: Never  Vaping Use   Vaping Use: Never used  Substance and Sexual  Activity   Alcohol use: Yes    Comment: occ. once a week   Drug use: Not Currently   Sexual activity: Yes    Birth control/protection: Surgical    Comment: Vasectomy  Other Topics Concern   Not on file  Social History Narrative   Married, mother.    3 kids   Asst. Principal at school    Mother has huntington's and lives in [place]   She has two younger sister   Right handed   Social Determinants of Health   Financial Resource Strain: Not on file  Food Insecurity: Not on file  Transportation Needs: Not on file  Physical Activity: Not on file  Stress: Not on file  Social Connections: Not on file  Intimate Partner Violence: Not on file    Review of Systems  Constitutional: Negative.   HENT: Negative.    Eyes: Negative.   Respiratory: Negative.    Cardiovascular: Negative.   Gastrointestinal: Negative.   Genitourinary: Negative.   Musculoskeletal:  Positive for joint pain. Negative for back pain, falls, myalgias and neck pain.  Skin: Negative.   Neurological: Negative.   Endo/Heme/Allergies:  Positive for environmental allergies. Negative for polydipsia. Does not bruise/bleed easily.  Psychiatric/Behavioral:  Negative for depression, hallucinations, memory loss, substance abuse and suicidal ideas. The patient is nervous/anxious. The patient does not have insomnia.     Physical Exam BP 110/70 (Cuff Size: Normal)   Pulse 87   Ht 5' 2.5" (1.588 m)   Wt 119 lb (54 kg)   LMP 02/09/2021 (Approximate)   BMI 21.42 kg/m   Physical Exam Constitutional:      General: She is not in acute distress.    Appearance: Normal appearance. She is well-developed.  Genitourinary:     Vulva and bladder normal.     No vaginal discharge, erythema, tenderness or bleeding.      Right Adnexa: not tender, not full and no mass present.    Left Adnexa: not tender, not full and no mass present.    No cervical motion tenderness, discharge, lesion or polyp.     Uterus is not enlarged or  tender.     No uterine mass detected.    Pelvic exam was performed with patient in the lithotomy position.  Breasts:    Right: No inverted nipple, mass, nipple discharge, skin change or tenderness.     Left: No inverted nipple, mass, nipple discharge, skin change or tenderness.  HENT:     Head: Normocephalic and atraumatic.  Eyes:     General: No scleral icterus.    Conjunctiva/sclera: Conjunctivae normal.  Neck:     Thyroid: No thyromegaly.  Cardiovascular:     Rate  and Rhythm: Normal rate and regular rhythm.     Heart sounds: No murmur heard.   No friction rub. No gallop.  Pulmonary:     Effort: Pulmonary effort is normal. No respiratory distress.     Breath sounds: Normal breath sounds. No wheezing or rales.  Abdominal:     General: Bowel sounds are normal. There is no distension.     Palpations: Abdomen is soft. There is no mass.     Tenderness: There is no abdominal tenderness. There is no guarding or rebound.  Musculoskeletal:        General: No swelling or tenderness. Normal range of motion.     Cervical back: Normal range of motion and neck supple.  Lymphadenopathy:     Cervical: No cervical adenopathy.     Lower Body: No right inguinal adenopathy. No left inguinal adenopathy.  Neurological:     General: No focal deficit present.     Mental Status: She is alert and oriented to person, place, and time.     Cranial Nerves: No cranial nerve deficit.  Skin:    General: Skin is warm and dry.     Findings: No erythema or rash.  Psychiatric:        Mood and Affect: Mood normal.        Behavior: Behavior normal.        Judgment: Judgment normal.   Female chaperone present for pelvic and breast  portions of the physical exam  Results: AUDIT Questionnaire (screen for alcoholism): 0 PHQ-9: 3  Assessment: 45 y.o. [id number] female here for routine gynecologic examination.  Plan: Problem List Items Addressed This Visit   None Visit Diagnoses     Women's annual routine  gynecological examination    -  Primary   Screening for depression       Screening for alcoholism          Screening: -- Blood pressure screen normal -- Colonoscopy -  considering Cologuard. -- Mammogram - due. Patient to call [place] to arrange. She understands that it is her responsibility to arrange this. -- Weight screening: normal -- Depression screening negative (PHQ-9) -- Nutrition: normal -- cholesterol screening: per PCP -- osteoporosis screening: not due -- tobacco screening: not using -- alcohol screening: AUDIT questionnaire indicates low-risk usage. -- family history of breast cancer screening: done. not at high risk. -- no evidence of domestic violence or intimate partner violence. -- STD screening: gonorrhea/chlamydia NAAT not collected per patient request. -- pap smear not collected per ASCCP guidelines  Thomasene Mohair, MD 03/07/2021 4:17 PM

## 2021-11-30 IMAGING — DX DG CERVICAL SPINE COMPLETE 4+V
5 series · 5 of 5 positions shown · non-contrast
Comparison: None.

CLINICAL DATA: Pain

EXAM:
CERVICAL SPINE - COMPLETE 4+ VIEW

[cervical spine ap]
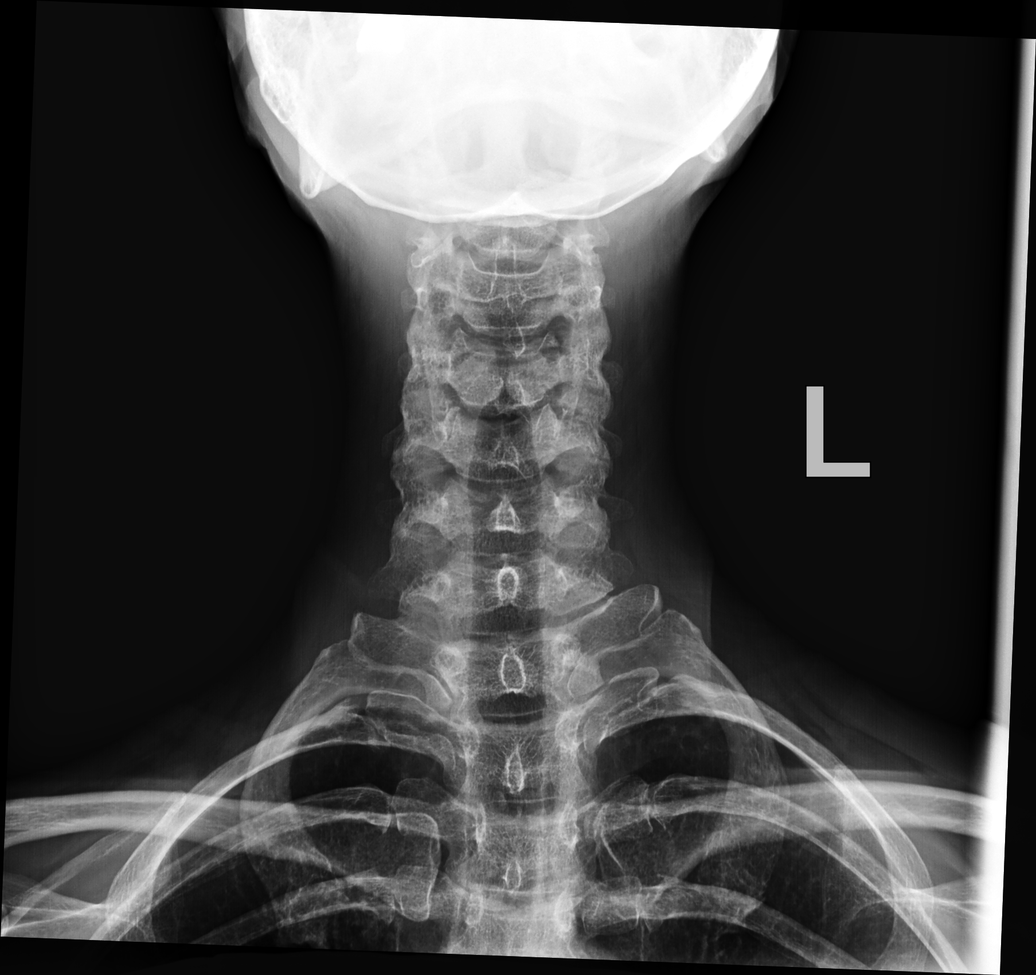

[cervical spine oblique (1 of 2)]
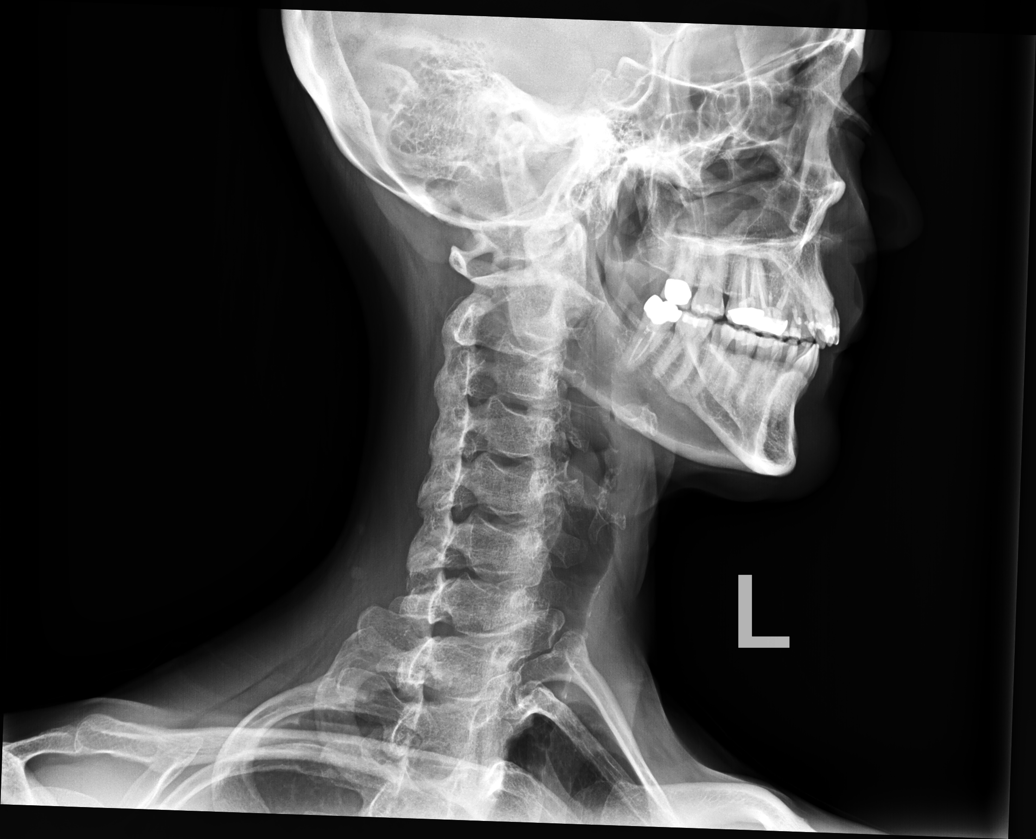

[cervical spine oblique (2 of 2)]
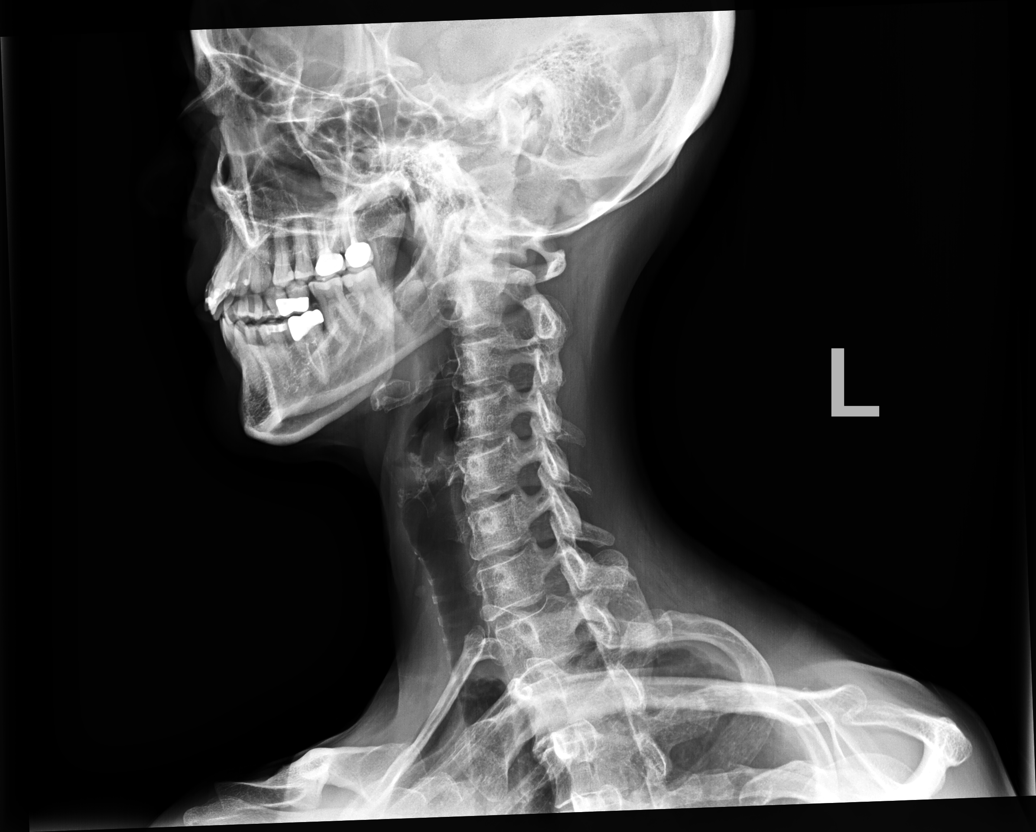

[cervical spine lat]
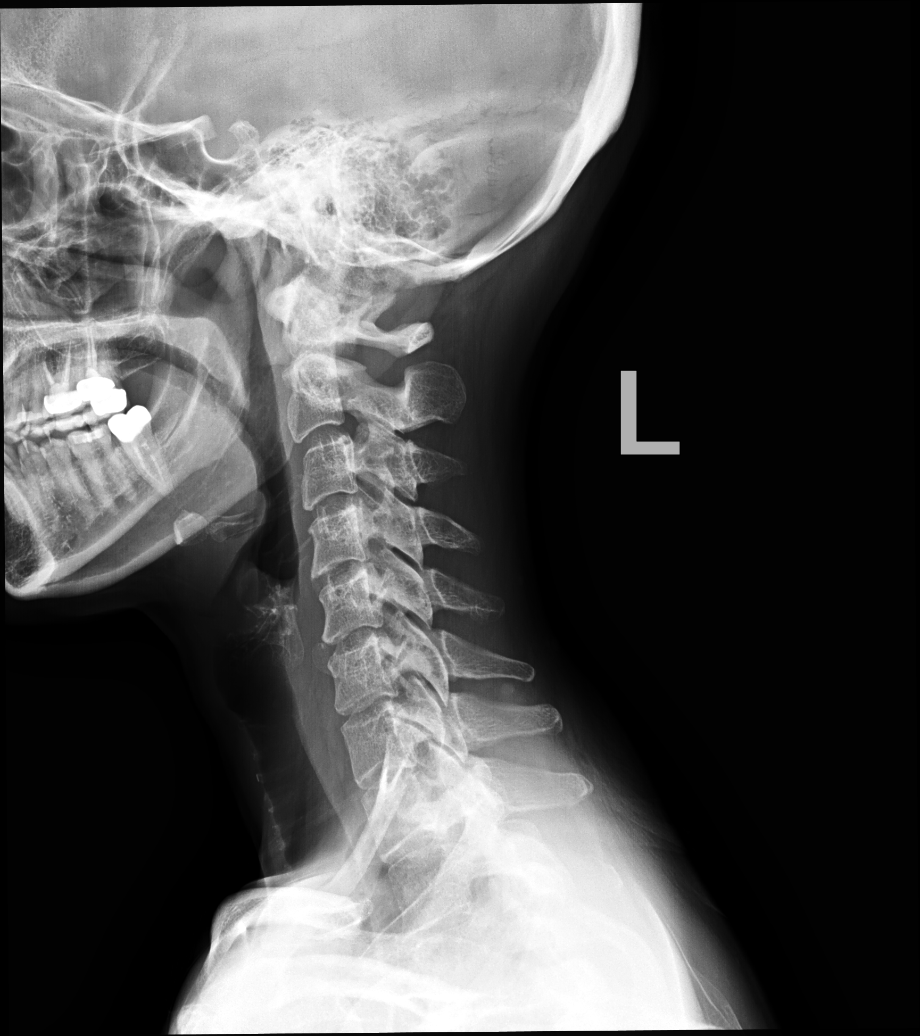

[cervical spine open mouth ap]
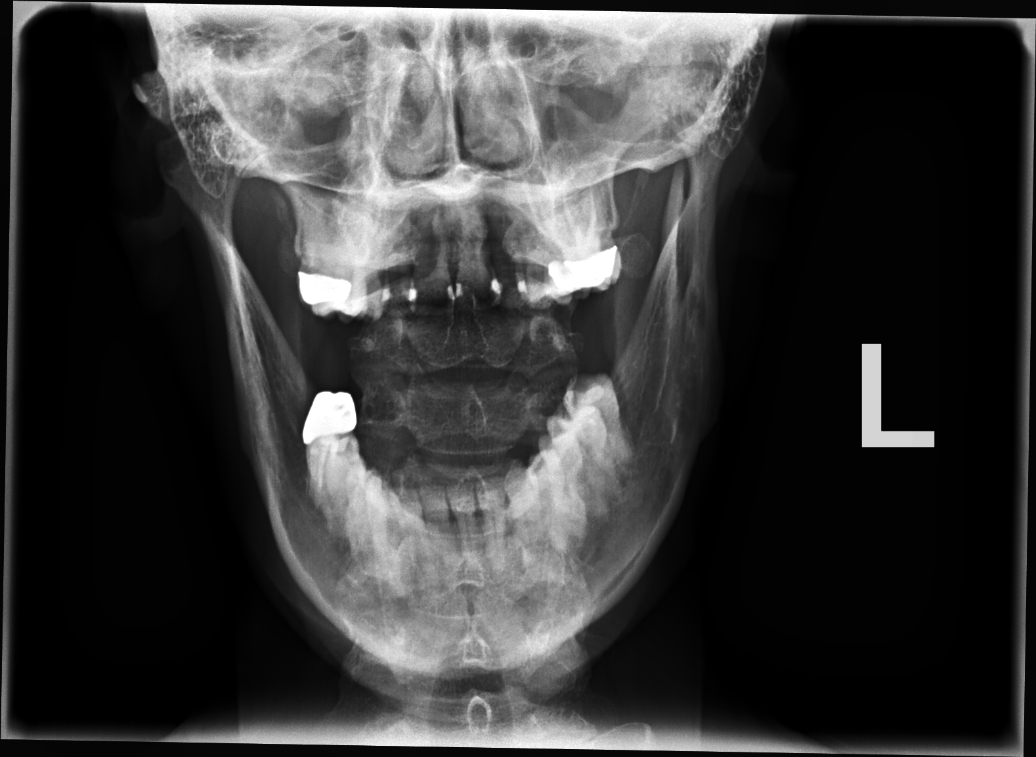

[5 of 5 positions shown; findings below may reference images not displayed]

FINDINGS: The cervical spine is visualized from C1-C7. Osseous neuroforaminal
or maintained.Cervical alignment is maintained. Vertebral body
heights are maintained: no evidence of acute fracture.
Intervertebral spaces are maintained without significant
degenerative changes. No prevertebral soft tissue swelling.
Visualized thorax is unremarkable.
IMPRESSION: Negative cervical spine radiographs.

## 2021-11-30 IMAGING — DX DG THORACIC SPINE 3V
3 series · 3 of 3 positions shown · non-contrast
Comparison: None.

CLINICAL DATA: Pain

EXAM:
THORACIC SPINE - 3 VIEWS

[thoracic spine ap]
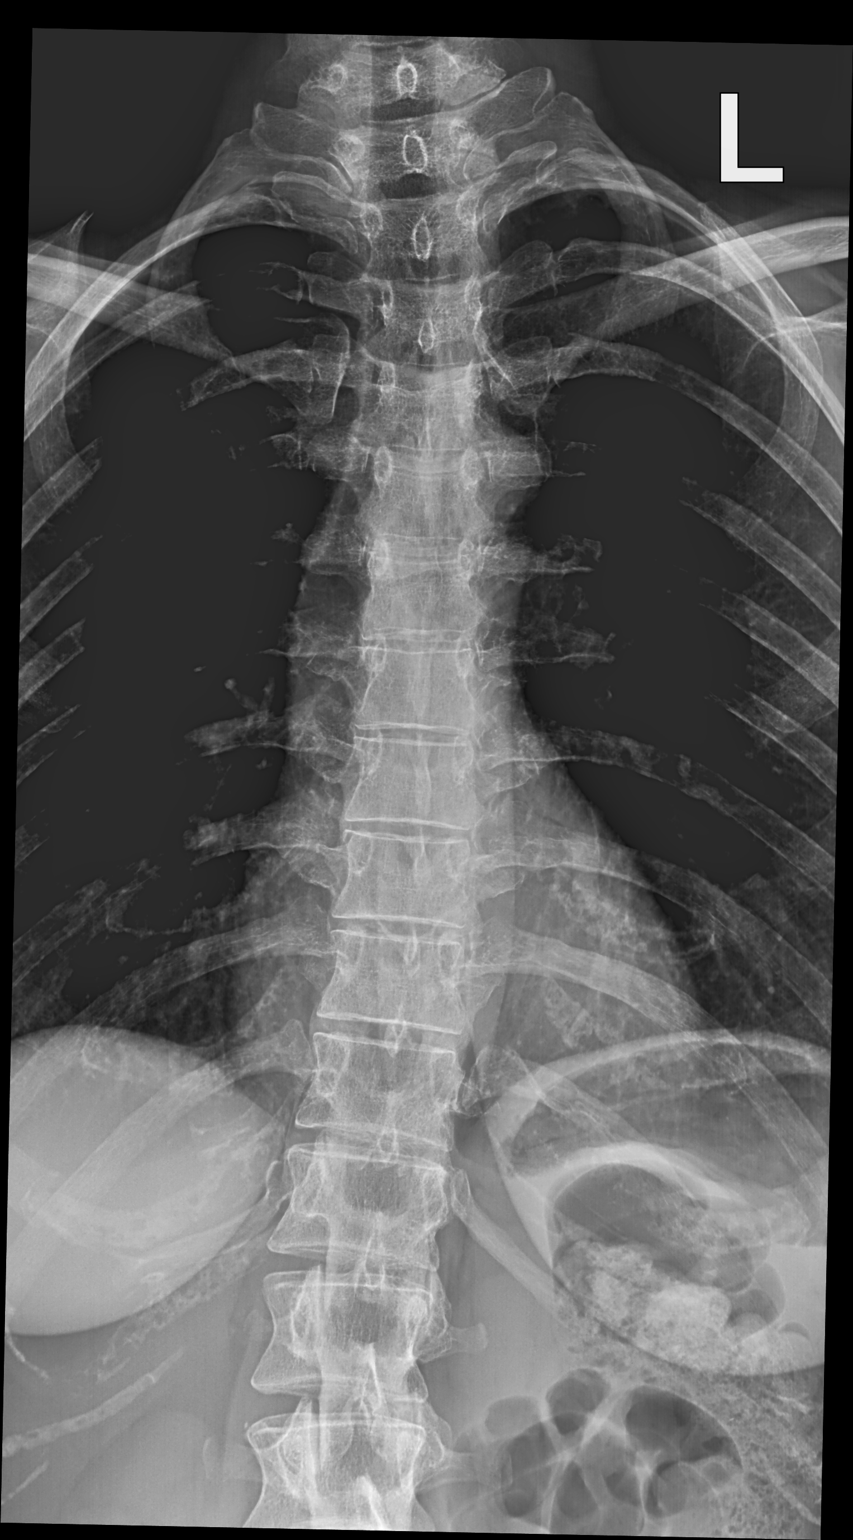

[thoracic spine lat]
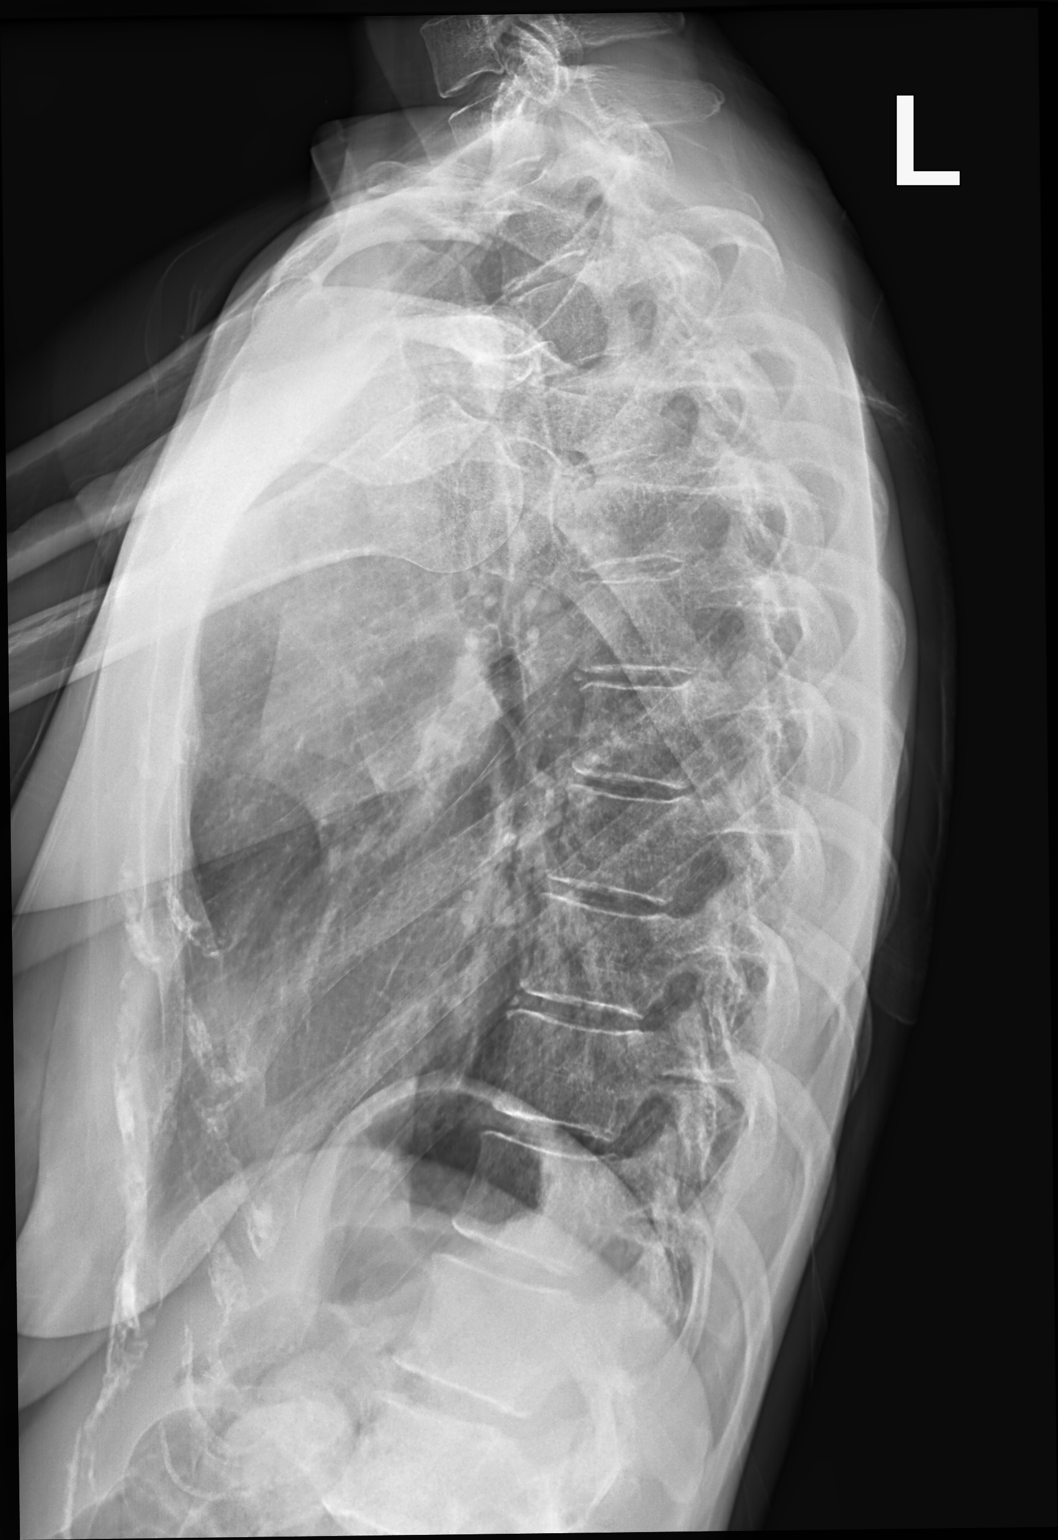

[swimmers lat]
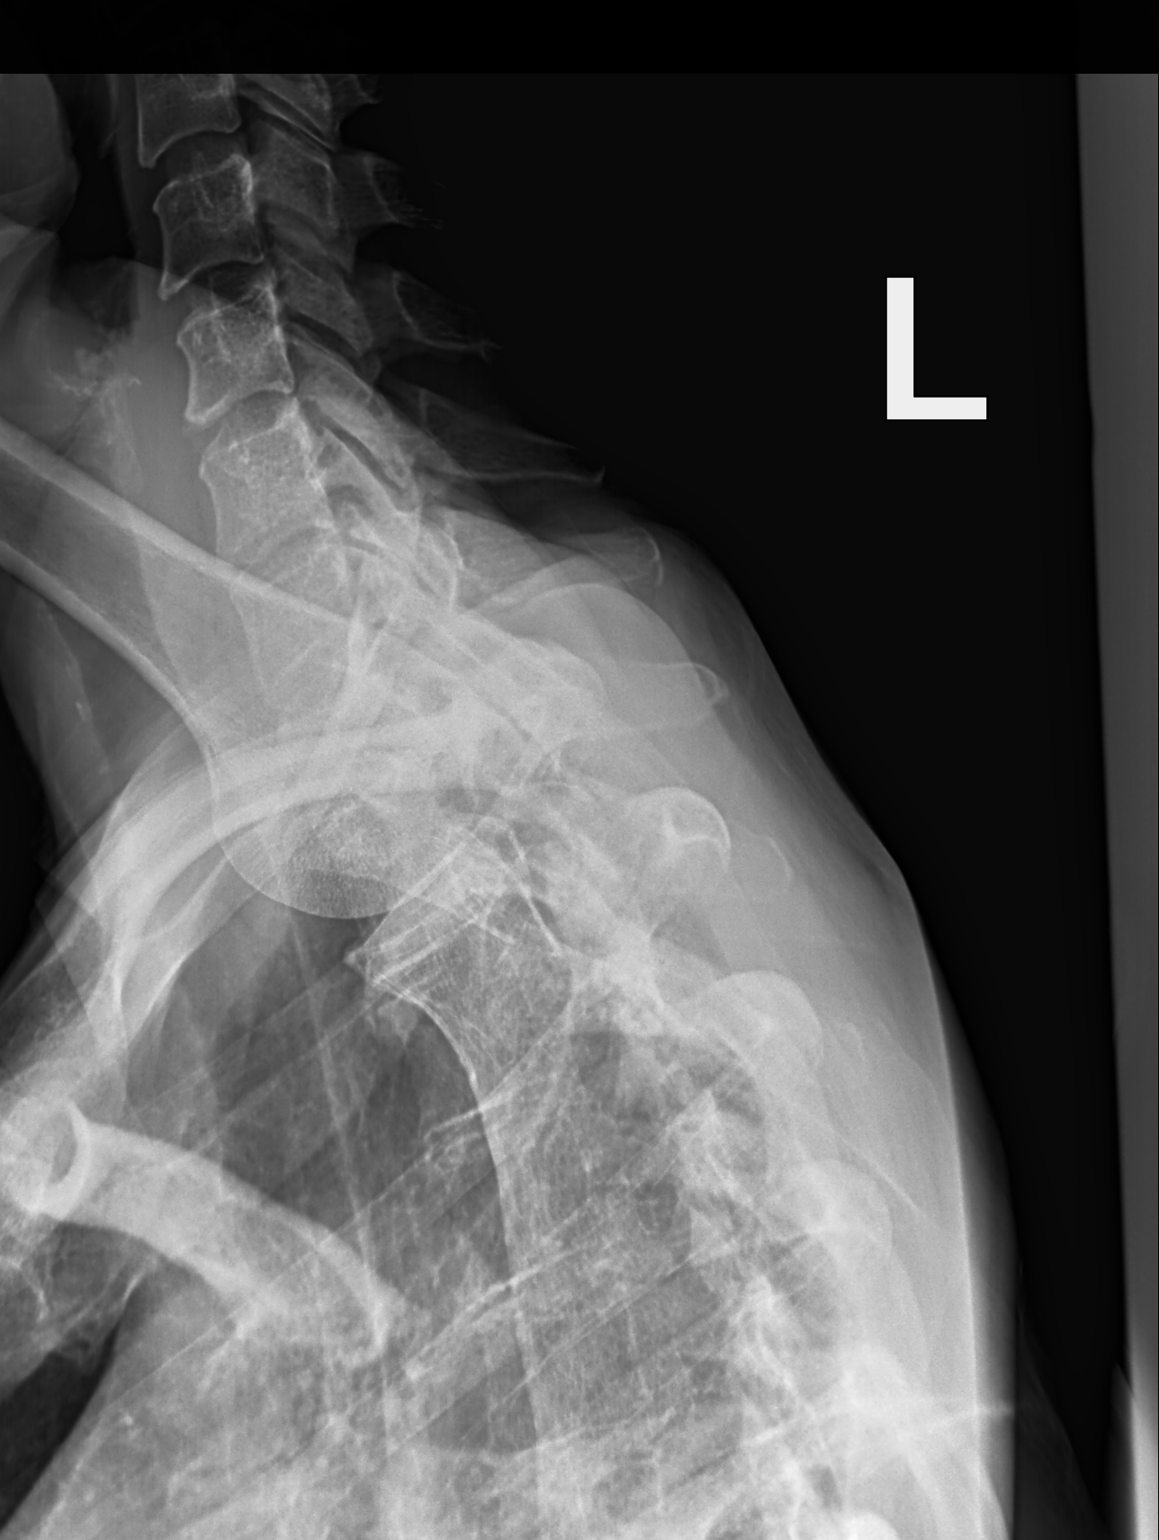

[3 of 3 positions shown; findings below may reference images not displayed]

FINDINGS: There is no evidence of thoracic spine fracture. Alignment is
normal. No other significant bone abnormalities are identified.
IMPRESSION: Negative.

## 2022-01-01 ENCOUNTER — Other Ambulatory Visit: Payer: Self-pay | Admitting: Obstetrics and Gynecology

## 2022-01-01 DIAGNOSIS — Z1231 Encounter for screening mammogram for malignant neoplasm of breast: Secondary | ICD-10-CM

## 2022-01-22 ENCOUNTER — Ambulatory Visit: Payer: BC Managed Care – PPO

## 2022-01-22 ENCOUNTER — Ambulatory Visit
Admission: RE | Admit: 2022-01-22 | Discharge: 2022-01-22 | Disposition: A | Discharge status: Home / Self Care | Payer: BC Managed Care – PPO | Source: Ambulatory Visit | Attending: Obstetrics and Gynecology | Admitting: Obstetrics and Gynecology

## 2022-01-22 DIAGNOSIS — Z1231 Encounter for screening mammogram for malignant neoplasm of breast: Secondary | ICD-10-CM

## 2022-12-22 ENCOUNTER — Other Ambulatory Visit: Payer: Self-pay | Admitting: Obstetrics and Gynecology

## 2022-12-22 DIAGNOSIS — Z1231 Encounter for screening mammogram for malignant neoplasm of breast: Secondary | ICD-10-CM

## 2022-12-23 ENCOUNTER — Other Ambulatory Visit: Payer: Self-pay | Admitting: Obstetrics and Gynecology

## 2022-12-23 DIAGNOSIS — Z1231 Encounter for screening mammogram for malignant neoplasm of breast: Secondary | ICD-10-CM

## 2023-01-15 ENCOUNTER — Other Ambulatory Visit: Payer: Self-pay | Admitting: Obstetrics and Gynecology

## 2023-01-15 DIAGNOSIS — Z1231 Encounter for screening mammogram for malignant neoplasm of breast: Secondary | ICD-10-CM

## 2023-01-23 DIAGNOSIS — Z1231 Encounter for screening mammogram for malignant neoplasm of breast: Secondary | ICD-10-CM

## 2023-01-26 DIAGNOSIS — Z1231 Encounter for screening mammogram for malignant neoplasm of breast: Secondary | ICD-10-CM

## 2023-01-28 ENCOUNTER — Ambulatory Visit
Admission: RE | Admit: 2023-01-28 | Discharge: 2023-01-28 | Disposition: A | Discharge status: Home / Self Care | Payer: BC Managed Care – PPO | Source: Ambulatory Visit

## 2023-01-28 DIAGNOSIS — Z1231 Encounter for screening mammogram for malignant neoplasm of breast: Secondary | ICD-10-CM

## 2023-04-06 ENCOUNTER — Encounter: Payer: Self-pay | Admitting: *Deleted

## 2023-09-16 ENCOUNTER — Other Ambulatory Visit: Payer: Self-pay | Admitting: *Deleted

## 2023-09-16 ENCOUNTER — Telehealth: Payer: Self-pay | Admitting: *Deleted

## 2023-09-16 ENCOUNTER — Telehealth: Payer: Self-pay

## 2023-09-16 DIAGNOSIS — Z1211 Encounter for screening for malignant neoplasm of colon: Secondary | ICD-10-CM

## 2023-09-16 MED ORDER — NA SULFATE-K SULFATE-MG SULF 17.5-3.13-1.6 GM/177ML PO SOLN: 1.0000 | Freq: Once | ORAL | 0 refills | Status: AC

## 2023-09-16 NOTE — Telephone Encounter (Signed)
 The patient called in to schedule her colonoscopy.

## 2023-09-16 NOTE — Telephone Encounter (Signed)
 Colonoscopy schedule on 12/10/2023 with Dr Servando Snare

## 2023-09-16 NOTE — Telephone Encounter (Signed)
 Gastroenterology Pre-Procedure Review  Request Date: 12/10/2023 Requesting Physician: Dr. Servando Snare  PATIENT REVIEW QUESTIONS: The patient responded to the following health history questions as indicated:    1. Are you having any GI issues? no 2. Do you have a personal history of Polyps? no 3. Do you have a family history of Colon Cancer or Polyps? no 4. Diabetes Mellitus? no 5. Joint replacements in the past 12 months?no 6. Major health problems in the past 3 months?no 7. Any artificial heart valves, MVP, or defibrillator?no    MEDICATIONS & ALLERGIES:    Patient reports the following regarding taking any anticoagulation/antiplatelet therapy:   Plavix, Coumadin, Eliquis, Xarelto, Lovenox, Pradaxa, Brilinta, or Effient? no Aspirin? no  Patient confirms/reports the following medications:  Current Outpatient Medications  Medication Sig Dispense Refill   Acetylcysteine (NAC) 600 MG CAPS Take 1 capsule by mouth daily.     Ascorbic Acid (VITAMIN C) 1000 MG tablet Take 1,000 mg by mouth daily. (Patient not taking: Reported on 03/07/2021)     b complex vitamins capsule Take 1 capsule by mouth daily. (Patient not taking: Reported on 03/07/2021)     Calcium-Magnesium-Vitamin D (CALCIUM MAGNESIUM PO) Take 1 tablet by mouth daily. Take one tablet by mouth daily. (Patient not taking: Reported on 03/07/2021)     Cholecalciferol (VITAMIN D3) 50 MCG (2000 UT) capsule Take 2,000 Units by mouth daily. (Patient not taking: Reported on 03/07/2021)     Omega-3 Fatty Acids (FISH OIL PO) Take 1 capsule by mouth daily. (Patient not taking: Reported on 03/07/2021)     Probiotic Product (PROBIOTIC PO) Take 1 capsule by mouth daily. (Patient not taking: Reported on 03/07/2021)     Quercetin 250 MG TABS Take 1 tablet by mouth in the morning and at bedtime. (Patient not taking: Reported on 03/07/2021)     Zinc 30 MG CAPS Take 1 capsule by mouth daily. (Patient not taking: Reported on 03/07/2021)     No current  facility-administered medications for this visit.    Patient confirms/reports the following allergies:  Allergies  Allergen Reactions   Doxycycline Other (See Comments)    Brain fog, not feeling well - most likely non IgE mediated side effect of medication.   Levaquin [Levofloxacin In D5w] Other (See Comments)    Brain fog, not feeling well - most likely non IgE mediated side effect of medication.   Multivitamins     maryruth MV; hives 30 minutes after    No orders of the defined types were placed in this encounter.   AUTHORIZATION INFORMATION Primary Insurance: 1D#: Group #:  Secondary Insurance: 1D#: Group #:  SCHEDULE INFORMATION: Date: 10/14/2023 Time: Location: [facility]

## 2023-12-10 ENCOUNTER — Ambulatory Visit

## 2023-12-10 ENCOUNTER — Ambulatory Visit
Admission: RE | Admit: 2023-12-10 | Discharge: 2023-12-10 | Disposition: A | Discharge status: Home / Self Care | Payer: Self-pay | Attending: Gastroenterology | Admitting: Gastroenterology

## 2023-12-10 ENCOUNTER — Encounter
Admission: RE | Disposition: A | Discharge status: Home / Self Care | Payer: Self-pay | Source: Home / Self Care | Attending: Gastroenterology

## 2023-12-10 ENCOUNTER — Encounter: Payer: Self-pay | Admitting: Gastroenterology

## 2023-12-10 DIAGNOSIS — Z1211 Encounter for screening for malignant neoplasm of colon: Secondary | ICD-10-CM | POA: Insufficient documentation

## 2023-12-10 DIAGNOSIS — K640 First degree hemorrhoids: Secondary | ICD-10-CM | POA: Diagnosis not present

## 2023-12-10 HISTORY — PX: COLONOSCOPY: SHX5424

## 2023-12-10 LAB — POCT PREGNANCY, URINE: Preg Test, Ur: NEGATIVE

## 2023-12-10 SURGERY — COLONOSCOPY
Anesthesia: General

## 2023-12-10 MED ORDER — PROPOFOL 500 MG/50ML IV EMUL
INTRAVENOUS | Status: DC | PRN
Start: 2023-12-10 — End: 2023-12-10
  Administered 2023-12-10: 100 ug/kg/min via INTRAVENOUS

## 2023-12-10 MED ORDER — PROPOFOL 10 MG/ML IV BOLUS
INTRAVENOUS | Status: AC
  Filled 2023-12-10: qty 20

## 2023-12-10 MED ORDER — EPHEDRINE SULFATE (PRESSORS) 50 MG/ML IJ SOLN
INTRAMUSCULAR | Status: DC | PRN
  Administered 2023-12-10: 5 mg via INTRAVENOUS

## 2023-12-10 MED ORDER — PROPOFOL 1000 MG/100ML IV EMUL
INTRAVENOUS | Status: AC
  Filled 2023-12-10: qty 100

## 2023-12-10 MED ORDER — LIDOCAINE HCL (CARDIAC) PF 100 MG/5ML IV SOSY
PREFILLED_SYRINGE | INTRAVENOUS | Status: DC | PRN
  Administered 2023-12-10: 40 mg via INTRAVENOUS

## 2023-12-10 MED ORDER — DEXMEDETOMIDINE HCL IN NACL 80 MCG/20ML IV SOLN
INTRAVENOUS | Status: DC | PRN
  Administered 2023-12-10: 4 ug via INTRAVENOUS
  Administered 2023-12-10: 2 ug via INTRAVENOUS

## 2023-12-10 MED ORDER — SODIUM CHLORIDE 0.9 % IV SOLN: INTRAVENOUS | Status: DC

## 2023-12-10 MED ORDER — PROPOFOL 10 MG/ML IV BOLUS
INTRAVENOUS | Status: DC | PRN
  Administered 2023-12-10: 80 mg via INTRAVENOUS

## 2023-12-10 NOTE — Transfer of Care (Signed)
 Immediate Anesthesia Transfer of Care Note  Patient: Nichole Crawford  Procedure(s) Performed: COLONOSCOPY  Patient Location: PACU  Anesthesia Type:General  Level of Consciousness: awake, alert , oriented, and patient cooperative  Airway & Oxygen Therapy: Patient Spontanous Breathing and Patient connected to nasal cannula oxygen  Post-op Assessment: Report given to RN, Post -op Vital signs reviewed and stable, and Patient moving all extremities X 4  Post vital signs: Reviewed and stable  Last Vitals:  Vitals Value Taken Time  BP 98/49 12/10/23 08:15  Temp    Pulse 84 12/10/23 08:15  Resp 18 12/10/23 08:15  SpO2 100 % 12/10/23 08:15  Vitals shown include unfiled device data.  Last Pain:  Vitals:   12/10/23 0726  TempSrc: Temporal  PainSc: 0-No pain         Complications: No notable events documented.

## 2023-12-10 NOTE — Op Note (Signed)
 [facility]enter[facility] Patient Name: Nichole Crawford Procedure Date: 12/10/2023 7:04 AM MRN: [id number] Account #: [id number] Date of Birth: [birth year] Admit Type: Outpatient Age: 48 Room: [facility] ENDO ROOM 4 Gender: Female Note Status: Finalized Instrument Name: [id number] Procedure:             Colonoscopy Indications:           Screening for colorectal malignant neoplasm Providers:             Rogelia Copping MD, MD Referring MD:          Oneil PHEBE Pinal, MD (Referring MD) Medicines:             Propofol per Anesthesia Complications:         No immediate complications. Procedure:             Pre-Anesthesia Assessment:                        - Prior to the procedure, a History and Physical was                         performed, and patient medications and allergies were                         reviewed. The patient's tolerance of previous                         anesthesia was also reviewed. The risks and benefits                         of the procedure and the sedation options and risks                         were discussed with the patient. All questions were                         answered, and informed consent was obtained. Prior                         Anticoagulants: The patient has taken no anticoagulant                         or antiplatelet agents. ASA Grade Assessment: II - A                         patient with mild systemic disease. After reviewing                         the risks and benefits, the patient was deemed in                         satisfactory condition to undergo the procedure.                        After obtaining informed consent, the colonoscope was                         passed under direct vision. Throughout the procedure,  the patient's blood pressure, pulse, and oxygen                         saturations were monitored continuously. The                         Colonoscope was introduced through  the anus and                         advanced to the the cecum, identified by appendiceal                         orifice and ileocecal valve. The colonoscopy was                         performed without difficulty. The patient tolerated                         the procedure well. The quality of the bowel                         preparation was excellent. Findings:      The perianal and digital rectal examinations were normal.      Non-bleeding internal hemorrhoids were found during retroflexion. The       hemorrhoids were Grade I (internal hemorrhoids that do not prolapse).      The exam was otherwise without abnormality. Impression:            - Non-bleeding internal hemorrhoids.                        - The examination was otherwise normal.                        - No specimens collected. Recommendation:        - Discharge patient to home.                        - Resume previous diet.                        - Continue present medications. Procedure Code(s):     --- Professional ---                        [phone], Colonoscopy, flexible; diagnostic, including                         collection of specimen(s) by brushing or washing, when                         performed (separate procedure) Diagnosis Code(s):     --- Professional ---                        Z12.11, Encounter for screening for malignant neoplasm                         of colon CPT copyright 2022 [employer]. All rights reserved. The codes documented in this report are preliminary and upon coder review may  be revised to  meet current compliance requirements. Rogelia Copping MD, MD 12/10/2023 8:17:50 AM This report has been signed electronically. Number of Addenda: 0 Note Initiated On: 12/10/2023 7:04 AM Scope Withdrawal Time: 0 hours 8 minutes 38 seconds  Total Procedure Duration: 0 hours 19 minutes 12 seconds  Estimated Blood Loss:  Estimated blood loss: none.      [facility]

## 2023-12-10 NOTE — Anesthesia Preprocedure Evaluation (Signed)
 Anesthesia Evaluation  Patient identified by MRN, date of birth, ID band Patient awake    Reviewed: Allergy  & Precautions, NPO status , Patient's Chart, lab work & pertinent test results  Airway Mallampati: II  TM Distance: >3 FB Neck ROM: full    Dental  (+) Teeth Intact   Pulmonary neg pulmonary ROS   Pulmonary exam normal        Cardiovascular Exercise Tolerance: Good negative cardio ROS Normal cardiovascular exam Rhythm:Regular Rate:Normal     Neuro/Psych  Headaches  Anxiety     negative neurological ROS  negative psych ROS   GI/Hepatic negative GI ROS, Neg liver ROS,,,  Endo/Other  negative endocrine ROS    Renal/GU negative Renal ROS  negative genitourinary   Musculoskeletal   Abdominal Normal abdominal exam  (+)   Peds negative pediatric ROS (+)  Hematology negative hematology ROS (+)   Anesthesia Other Findings Past Medical History: No date: Abnormal Pap smear of cervix No date: Allergy      Comment:  Hay fever/seasonal allergies  No date: Chicken pox No date: Migraines No date: Urticaria  Past Surgical History: No date: NO PAST SURGERIES  BMI    Body Mass Index: 21.06 kg/m      Reproductive/Obstetrics negative OB ROS                             Anesthesia Physical Anesthesia Plan  ASA: 1  Anesthesia Plan: General   Post-op Pain Management:    Induction: Intravenous  PONV Risk Score and Plan: Propofol infusion and TIVA  Airway Management Planned: Natural Airway and Nasal Cannula  Additional Equipment:   Intra-op Plan:   Post-operative Plan:   Informed Consent: I have reviewed the patients History and Physical, chart, labs and discussed the procedure including the risks, benefits and alternatives for the proposed anesthesia with the patient or authorized representative who has indicated his/her understanding and acceptance.     Dental Advisory  Given  Plan Discussed with: CRNA  Anesthesia Plan Comments:        Anesthesia Quick Evaluation

## 2023-12-10 NOTE — H&P (Signed)
 Nichole Copping, MD [facility] [address] [place], [place] [address] Phone: [phone] Fax : [phone]  Primary Care Physician:  Cleotilde Oneil FALCON, MD Primary Gastroenterologist:  Dr. Copping  Pre-Procedure History & Physical: HPI:  Nichole Crawford is a 48 y.o. female is here for a screening colonoscopy.   Past Medical History:  Diagnosis Date   Abnormal Pap smear of cervix    Allergy     Hay fever/seasonal allergies    Chicken pox    Migraines    Urticaria     Past Surgical History:  Procedure Laterality Date   NO PAST SURGERIES      Prior to Admission medications   Medication Sig Start Date End Date Taking? Authorizing Provider  Acetylcysteine (NAC) 600 MG CAPS Take 1 capsule by mouth daily.    [provider]  Ascorbic Acid (VITAMIN C) 1000 MG tablet Take 1,000 mg by mouth daily. Patient not taking: Reported on 03/07/2021    [provider]  b complex vitamins capsule Take 1 capsule by mouth daily. Patient not taking: Reported on 03/07/2021    [provider]  Calcium-Magnesium-Vitamin D  (CALCIUM MAGNESIUM PO) Take 1 tablet by mouth daily. Take one tablet by mouth daily. Patient not taking: Reported on 03/07/2021    [provider]  Cholecalciferol (VITAMIN D3) 50 MCG (2000 UT) capsule Take 2,000 Units by mouth daily. Patient not taking: Reported on 03/07/2021    [provider]  Omega-3 Fatty Acids (FISH OIL PO) Take 1 capsule by mouth daily. Patient not taking: Reported on 03/07/2021    [provider]  Probiotic Product (PROBIOTIC PO) Take 1 capsule by mouth daily. Patient not taking: Reported on 03/07/2021    [provider]  Quercetin 250 MG TABS Take 1 tablet by mouth in the morning and at bedtime. Patient not taking: Reported on 03/07/2021    [provider]  Zinc 30 MG CAPS Take 1 capsule by mouth daily. Patient not taking: Reported on 03/07/2021    [provider]    Allergies as of  09/16/2023 - Review Complete 03/07/2021  Allergen Reaction Noted   Doxycycline Other (See Comments) 09/23/2017   Levaquin [levofloxacin in d5w] Other (See Comments) 09/23/2017   Multivitamins  01/23/2020    Family History  Problem Relation Age of Onset   Huntington's disease Mother    Asthma Mother    Other Mother        Huntington's disease   Mental illness Sister        mulitple personality disorder   Endometrial cancer Maternal Aunt 18       No genetic testing   Uterine cancer Maternal Grandmother    COPD Maternal Grandmother    Other Maternal Grandmother        Huntington's disease   Heart disease Maternal Grandfather    Hyperlipidemia Maternal Grandfather    Asthma Paternal Grandmother    Drug abuse Paternal Grandmother    Heart disease Paternal Grandmother    Hyperlipidemia Paternal Grandmother    Kidney disease Paternal Grandmother    Alzheimer's disease Paternal Grandfather     Social History   Socioeconomic History   Marital status: Married    Spouse name: Not on file   Number of children: Not on file   Years of education: Not on file   Highest education level: Not on file  Occupational History   Not on file  Tobacco Use   Smoking status: Never   Smokeless tobacco:  Never  Vaping Use   Vaping status: Never Used  Substance and Sexual Activity   Alcohol use: Yes    Comment: occ. once a week   Drug use: Not Currently   Sexual activity: Yes    Birth control/protection: Surgical    Comment: Vasectomy  Other Topics Concern   Not on file  Social History Narrative   Married, mother.    3 kids   Asst. Principal at school    Mother has huntington's and lives in [place]    She has two younger sister   Right handed   Social Drivers of Health   Financial Resource Strain: Low Risk  (10/26/2023)   Received from [facility]   Overall Financial Resource Strain (CARDIA)    Difficulty of Paying Living Expenses: Not hard at all  Food  Insecurity: No Food Insecurity (10/26/2023)   Received from [facility]   Hunger Vital Sign    Within the past 12 months, you worried that your food would run out before you got the money to buy more.: Never true    Within the past 12 months, the food you bought just didn't last and you didn't have money to get more.: Never true  Transportation Needs: No Transportation Needs (10/26/2023)   Received from [facility] - Transportation    In the past 12 months, has lack of transportation kept you from medical appointments or from getting medications?: No    Lack of Transportation (Non-Medical): No  Physical Activity: Insufficiently Active (09/25/2022)   Received from [facility]   Exercise Vital Sign    On average, how many days per week do you engage in moderate to strenuous exercise (like a brisk walk)?: 3 days    On average, how many minutes do you engage in exercise at this level?: 30 min  Stress: No Stress Concern Present (09/25/2022)   Received from [facility] - Occupational Stress Questionnaire    Feeling of Stress : Not at all  Social Connections: Moderately Integrated (09/25/2022)   Received from [facility]   Social Connection and Isolation Panel    In a typical week, how many times do you talk on the phone with family, friends, or neighbors?: More than three times a week    How often do you get together with friends or relatives?: Three times a week    How often do you attend church or religious services?: More than 4 times per year    Do you belong to any clubs or organizations such as church groups, unions, fraternal or athletic groups, or school groups?: No    How often do you attend meetings of the clubs or organizations you belong to?: Never    Are you married, widowed, divorced, separated, never married, or living with a partner?: Married   Intimate Partner Violence: Not on file    Review of Systems: See HPI, otherwise negative ROS  Physical Exam: Ht 5' 2.5 (1.588 m)   Wt 53.1 kg   BMI 21.06 kg/m  General:   Alert,  pleasant and cooperative in NAD Head:  Normocephalic and atraumatic. Neck:  Supple; no masses or thyromegaly. Lungs:  Clear throughout to auscultation.    Heart:  Regular rate and rhythm. Abdomen:  Soft, nontender and nondistended. Normal bowel sounds, without guarding, and without rebound.   Neurologic:  Alert and  oriented  x4;  grossly normal neurologically.  Impression/Plan: Nichole Crawford is now here to undergo a screening colonoscopy.  Risks, benefits, and alternatives regarding colonoscopy have been reviewed with the patient.  Questions have been answered.  All parties agreeable.

## 2023-12-10 NOTE — Anesthesia Postprocedure Evaluation (Signed)
 Anesthesia Post Note  Patient: Nichole Crawford  Procedure(s) Performed: COLONOSCOPY  Patient location during evaluation: PACU Anesthesia Type: General Level of consciousness: awake and alert Pain management: satisfactory to patient Vital Signs Assessment: post-procedure vital signs reviewed and stable Respiratory status: spontaneous breathing Cardiovascular status: blood pressure returned to baseline Anesthetic complications: no   No notable events documented.   Last Vitals:  Vitals:   12/10/23 0815 12/10/23 0825  BP: (!) 98/49 104/68  Pulse:    Resp:    Temp: (!) 36.1 C   SpO2:      Last Pain:  Vitals:   12/10/23 0825  TempSrc:   PainSc: 0-No pain                 VAN STAVEREN,Loxley Schmale

## 2024-02-24 ENCOUNTER — Encounter

## 2024-02-24 DIAGNOSIS — Z1231 Encounter for screening mammogram for malignant neoplasm of breast: Secondary | ICD-10-CM

## 2024-02-29 ENCOUNTER — Other Ambulatory Visit: Payer: Self-pay | Admitting: Obstetrics and Gynecology

## 2024-02-29 DIAGNOSIS — Z1231 Encounter for screening mammogram for malignant neoplasm of breast: Secondary | ICD-10-CM

## 2024-03-02 ENCOUNTER — Ambulatory Visit

## 2024-03-02 DIAGNOSIS — Z1231 Encounter for screening mammogram for malignant neoplasm of breast: Secondary | ICD-10-CM

## 2024-03-03 ENCOUNTER — Ambulatory Visit
Admission: RE | Admit: 2024-03-03 | Discharge: 2024-03-03 | Disposition: A | Discharge status: Home / Self Care | Source: Ambulatory Visit

## 2024-03-03 DIAGNOSIS — Z1231 Encounter for screening mammogram for malignant neoplasm of breast: Secondary | ICD-10-CM
